# Patient Record
Sex: Female | Born: 1970
Health system: Southern US, Community
[De-identification: ages and names within clinical notes are randomized; demographics above are authoritative.]

## PROBLEM LIST (undated history)

## (undated) DIAGNOSIS — F319 Bipolar disorder, unspecified: Secondary | ICD-10-CM

## (undated) DIAGNOSIS — M797 Fibromyalgia: Secondary | ICD-10-CM

## (undated) DIAGNOSIS — M1990 Unspecified osteoarthritis, unspecified site: Secondary | ICD-10-CM

## (undated) DIAGNOSIS — F4310 Post-traumatic stress disorder, unspecified: Secondary | ICD-10-CM

## (undated) DIAGNOSIS — E119 Type 2 diabetes mellitus without complications: Secondary | ICD-10-CM

## (undated) DIAGNOSIS — M549 Dorsalgia, unspecified: Secondary | ICD-10-CM

## (undated) HISTORY — PX: COMPARTMENT SYNDROME MEASUREMENT: CATH118296

---

## 1998-02-18 ENCOUNTER — Emergency Department (HOSPITAL_COMMUNITY): Admission: EM | Admit: 1998-02-18 | Discharge: 1998-02-18 | Payer: Self-pay | Admitting: Emergency Medicine

## 1998-10-11 ENCOUNTER — Encounter: Payer: Self-pay | Admitting: Emergency Medicine

## 1998-10-11 ENCOUNTER — Emergency Department (HOSPITAL_COMMUNITY): Admission: EM | Admit: 1998-10-11 | Discharge: 1998-10-11 | Payer: Self-pay | Admitting: Emergency Medicine

## 1999-03-23 ENCOUNTER — Encounter: Payer: Self-pay | Admitting: Emergency Medicine

## 1999-03-23 ENCOUNTER — Emergency Department (HOSPITAL_COMMUNITY): Admission: EM | Admit: 1999-03-23 | Discharge: 1999-03-23 | Payer: Self-pay | Admitting: Emergency Medicine

## 1999-09-06 ENCOUNTER — Emergency Department (HOSPITAL_COMMUNITY): Admission: EM | Admit: 1999-09-06 | Discharge: 1999-09-06 | Payer: Self-pay

## 1999-12-15 ENCOUNTER — Emergency Department (HOSPITAL_COMMUNITY): Admission: EM | Admit: 1999-12-15 | Discharge: 1999-12-15 | Payer: Self-pay

## 2000-06-06 ENCOUNTER — Encounter: Payer: Self-pay | Admitting: Internal Medicine

## 2000-06-06 ENCOUNTER — Emergency Department (HOSPITAL_COMMUNITY): Admission: EM | Admit: 2000-06-06 | Discharge: 2000-06-06 | Payer: Self-pay | Admitting: Internal Medicine

## 2000-12-31 ENCOUNTER — Encounter: Payer: Self-pay | Admitting: Emergency Medicine

## 2000-12-31 ENCOUNTER — Emergency Department (HOSPITAL_COMMUNITY): Admission: EM | Admit: 2000-12-31 | Discharge: 2000-12-31 | Payer: Self-pay | Admitting: Emergency Medicine

## 2003-01-07 ENCOUNTER — Ambulatory Visit (HOSPITAL_COMMUNITY): Admission: RE | Admit: 2003-01-07 | Discharge: 2003-01-07 | Payer: Self-pay | Admitting: Family Medicine

## 2003-01-07 ENCOUNTER — Encounter: Payer: Self-pay | Admitting: Family Medicine

## 2003-03-09 ENCOUNTER — Encounter: Admission: RE | Admit: 2003-03-09 | Discharge: 2003-03-09 | Payer: Self-pay | Admitting: Family Medicine

## 2003-03-09 ENCOUNTER — Encounter: Payer: Self-pay | Admitting: Radiology

## 2003-03-09 ENCOUNTER — Encounter: Payer: Self-pay | Admitting: Family Medicine

## 2003-03-28 ENCOUNTER — Encounter: Payer: Self-pay | Admitting: Family Medicine

## 2003-03-28 ENCOUNTER — Encounter: Admission: RE | Admit: 2003-03-28 | Discharge: 2003-03-28 | Payer: Self-pay | Admitting: Family Medicine

## 2003-04-11 ENCOUNTER — Encounter: Payer: Self-pay | Admitting: Family Medicine

## 2003-04-11 ENCOUNTER — Encounter: Admission: RE | Admit: 2003-04-11 | Discharge: 2003-04-11 | Payer: Self-pay | Admitting: Family Medicine

## 2003-04-17 ENCOUNTER — Emergency Department (HOSPITAL_COMMUNITY): Admission: EM | Admit: 2003-04-17 | Discharge: 2003-04-17 | Payer: Self-pay | Admitting: Emergency Medicine

## 2003-04-17 ENCOUNTER — Encounter: Payer: Self-pay | Admitting: Emergency Medicine

## 2003-06-04 ENCOUNTER — Emergency Department (HOSPITAL_COMMUNITY): Admission: EM | Admit: 2003-06-04 | Discharge: 2003-06-04 | Payer: Self-pay | Admitting: Emergency Medicine

## 2003-06-22 ENCOUNTER — Emergency Department (HOSPITAL_COMMUNITY): Admission: EM | Admit: 2003-06-22 | Discharge: 2003-06-22 | Payer: Self-pay | Admitting: *Deleted

## 2003-07-07 ENCOUNTER — Emergency Department (HOSPITAL_COMMUNITY): Admission: EM | Admit: 2003-07-07 | Discharge: 2003-07-08 | Payer: Self-pay | Admitting: Emergency Medicine

## 2003-12-20 ENCOUNTER — Emergency Department (HOSPITAL_COMMUNITY): Admission: EM | Admit: 2003-12-20 | Discharge: 2003-12-20 | Payer: Self-pay | Admitting: Emergency Medicine

## 2004-01-16 ENCOUNTER — Emergency Department (HOSPITAL_COMMUNITY): Admission: EM | Admit: 2004-01-16 | Discharge: 2004-01-16 | Payer: Self-pay | Admitting: Emergency Medicine

## 2004-02-17 ENCOUNTER — Emergency Department (HOSPITAL_COMMUNITY): Admission: EM | Admit: 2004-02-17 | Discharge: 2004-02-17 | Payer: Self-pay | Admitting: Emergency Medicine

## 2004-04-14 ENCOUNTER — Emergency Department (HOSPITAL_COMMUNITY): Admission: EM | Admit: 2004-04-14 | Discharge: 2004-04-14 | Payer: Self-pay | Admitting: Emergency Medicine

## 2004-05-21 ENCOUNTER — Emergency Department (HOSPITAL_COMMUNITY): Admission: EM | Admit: 2004-05-21 | Discharge: 2004-05-21 | Payer: Self-pay | Admitting: Emergency Medicine

## 2004-05-28 ENCOUNTER — Emergency Department (HOSPITAL_COMMUNITY): Admission: EM | Admit: 2004-05-28 | Discharge: 2004-05-28 | Payer: Self-pay | Admitting: Emergency Medicine

## 2004-05-29 ENCOUNTER — Emergency Department (HOSPITAL_COMMUNITY): Admission: EM | Admit: 2004-05-29 | Discharge: 2004-05-29 | Payer: Self-pay | Admitting: Emergency Medicine

## 2004-07-05 ENCOUNTER — Emergency Department (HOSPITAL_COMMUNITY): Admission: EM | Admit: 2004-07-05 | Discharge: 2004-07-05 | Payer: Self-pay | Admitting: Emergency Medicine

## 2004-09-26 ENCOUNTER — Emergency Department (HOSPITAL_COMMUNITY): Admission: EM | Admit: 2004-09-26 | Discharge: 2004-09-26 | Payer: Self-pay | Admitting: *Deleted

## 2015-01-30 ENCOUNTER — Emergency Department (HOSPITAL_COMMUNITY): Payer: Medicaid Other

## 2015-01-30 ENCOUNTER — Encounter (HOSPITAL_COMMUNITY): Payer: Self-pay | Admitting: Emergency Medicine

## 2015-01-30 ENCOUNTER — Emergency Department (HOSPITAL_COMMUNITY)
Admission: EM | Admit: 2015-01-30 | Discharge: 2015-01-30 | Disposition: A | Discharge status: Home / Self Care | Payer: Medicaid Other | Attending: Physician Assistant | Admitting: Physician Assistant

## 2015-01-30 DIAGNOSIS — Y939 Activity, unspecified: Secondary | ICD-10-CM | POA: Insufficient documentation

## 2015-01-30 DIAGNOSIS — W228XXA Striking against or struck by other objects, initial encounter: Secondary | ICD-10-CM | POA: Insufficient documentation

## 2015-01-30 DIAGNOSIS — M797 Fibromyalgia: Secondary | ICD-10-CM | POA: Diagnosis not present

## 2015-01-30 DIAGNOSIS — S6992XA Unspecified injury of left wrist, hand and finger(s), initial encounter: Secondary | ICD-10-CM | POA: Diagnosis present

## 2015-01-30 DIAGNOSIS — Y999 Unspecified external cause status: Secondary | ICD-10-CM | POA: Insufficient documentation

## 2015-01-30 DIAGNOSIS — E119 Type 2 diabetes mellitus without complications: Secondary | ICD-10-CM | POA: Insufficient documentation

## 2015-01-30 DIAGNOSIS — Z79899 Other long term (current) drug therapy: Secondary | ICD-10-CM | POA: Diagnosis not present

## 2015-01-30 DIAGNOSIS — M1990 Unspecified osteoarthritis, unspecified site: Secondary | ICD-10-CM | POA: Diagnosis not present

## 2015-01-30 DIAGNOSIS — Y929 Unspecified place or not applicable: Secondary | ICD-10-CM | POA: Diagnosis not present

## 2015-01-30 DIAGNOSIS — S63617A Unspecified sprain of left little finger, initial encounter: Secondary | ICD-10-CM | POA: Insufficient documentation

## 2015-01-30 DIAGNOSIS — F319 Bipolar disorder, unspecified: Secondary | ICD-10-CM | POA: Diagnosis not present

## 2015-01-30 DIAGNOSIS — S63619A Unspecified sprain of unspecified finger, initial encounter: Secondary | ICD-10-CM

## 2015-01-30 HISTORY — DX: Post-traumatic stress disorder, unspecified: F43.10

## 2015-01-30 HISTORY — DX: Bipolar disorder, unspecified: F31.9

## 2015-01-30 HISTORY — DX: Type 2 diabetes mellitus without complications: E11.9

## 2015-01-30 HISTORY — DX: Unspecified osteoarthritis, unspecified site: M19.90

## 2015-01-30 HISTORY — DX: Fibromyalgia: M79.7

## 2015-01-30 HISTORY — DX: Dorsalgia, unspecified: M54.9

## 2015-01-30 MED ORDER — HYDROCODONE-ACETAMINOPHEN 5-325 MG PO TABS: 1.0000 | ORAL_TABLET | Freq: Four times a day (QID) | ORAL | Status: DC | PRN

## 2015-01-30 NOTE — Discharge Instructions (Signed)

## 2015-01-30 NOTE — ED Notes (Signed)
Pt stated that she feels like her finger s dislocated. Pt fell forward last night, and grabbed railing with l/hand, 5th finger caught the top of the rail.

## 2015-01-30 NOTE — ED Provider Notes (Signed)
CSN: [id number]     Arrival date & time 01/30/15  1031 History   First MD Initiated Contact with Patient 01/30/15 1101     Chief Complaint  Patient presents with  . Hand Pain    pt hit railing with l./hand     (Consider location/radiation/quality/duration/timing/severity/associated sxs/prior Treatment) HPI Comments: Patient presents to the emergency department with chief complaint of left small finger pain. She states that she stumbled last night and caught herself on her deck railing with her small finger. She complains of persistent pain. She has not tried taking anything to alleviate the symptoms. It is aggravated with movement and palpation. Pain radiates into her hand. There are no other associated symptoms.  The history is provided by the patient. No language interpreter was used.    Past Medical History  Diagnosis Date  . Diabetes mellitus without complication   . Back pain   . Arthritis   . Fibromyalgia   . Bipolar 1 disorder   . PTSD (post-traumatic stress disorder)    Past Surgical History  Procedure Laterality Date  . Cesarean section     Family History  Problem Relation Age of Onset  . Diabetes Mother   . Cancer Mother   . Hypertension Mother   . Diabetes Father   . Cancer Father   . Hypertension Father    History  Substance Use Topics  . Smoking status: Never Smoker   . Smokeless tobacco: Not on file  . Alcohol Use: No   OB History    No data available     Review of Systems  Constitutional: Negative for fever and chills.  Respiratory: Negative for shortness of breath.   Cardiovascular: Negative for chest pain.  Gastrointestinal: Negative for nausea, vomiting, diarrhea and constipation.  Genitourinary: Negative for dysuria.  Musculoskeletal: Positive for arthralgias.      Allergies  Ibuprofen  Home Medications   Prior to Admission medications   Medication Sig Start Date End Date Taking? Authorizing Provider  Aspirin-Acetaminophen-Caffeine  (GOODY HEADACHE PO) Take 1 each by mouth 3 (three) times daily as needed (headaches).   Yes Historical Provider, MD  metFORMIN (GLUCOPHAGE) 500 MG tablet Take 500 mg by mouth 2 (two) times daily with a meal.   Yes Historical Provider, MD  PARoxetine (PAXIL) 10 MG tablet Take 10 mg by mouth daily.   Yes Historical Provider, MD  traMADol (ULTRAM) 50 MG tablet Take 50 mg by mouth every 6 (six) hours as needed for moderate pain.   Yes Historical Provider, MD   BP 109/55 mmHg  Pulse 84  SpO2 99%  LMP 01/23/2015 (Exact Date) Physical Exam  Constitutional: She is oriented to person, place, and time. She appears well-developed and well-nourished.  HENT:  Head: Normocephalic and atraumatic.  Eyes: Conjunctivae and EOM are normal.  Neck: Normal range of motion.  Cardiovascular: Normal rate and intact distal pulses.   Intact distal pulses with brisk cap refill  Pulmonary/Chest: Effort normal.  Abdominal: She exhibits no distension.  Musculoskeletal: Normal range of motion.  Left small finger tender to palpation at the proximal phalanx and MCP, there is moderate associated metacarpal tenderness, range of motion and strength is limited secondary to pain  Neurological: She is alert and oriented to person, place, and time.  Sensation intact  Skin: Skin is dry.  Psychiatric: She has a normal mood and affect. Her behavior is normal. Judgment and thought content normal.  Nursing note and vitals reviewed.   ED Course  Procedures (including  critical care time) Labs Review Labs Reviewed - No data to display  Imaging Review No results found.   EKG Interpretation None     SPLINT APPLICATION Date/Time: 1:20 PM Authorized by: Roxy Horseman Consent: Verbal consent obtained. Risks and benefits: risks, benefits and alternatives were discussed Consent given by: patient Splint applied by: RN Location details: left 5th finger Splint type: static Supplies used: coband and straight finger  splint Post-procedure: The splinted body part was neurovascularly unchanged following the procedure. Patient tolerance: Patient tolerated the procedure well with no immediate complications.    MDM   Final diagnoses:  Finger sprain, initial encounter    Patient with left small finger injury.  Will check plain films.  Will reassess.  Plain films are negative.  Will treat for finger sprain.    Roxy Horseman, PA-C 01/30/15 1321  Courteney Lyn Corlis Leak, MD 01/30/15 1459

## 2015-01-31 NOTE — Progress Notes (Signed)
Orthopedic Tech Progress Note Patient Details:  Jenny Combs 1970-12-11 [id number]  Ortho Devices Type of Ortho Device: Finger splint   [name] 01/31/2015, 5:48 PM

## 2016-09-23 ENCOUNTER — Encounter: Payer: Self-pay | Admitting: Emergency Medicine

## 2016-09-23 ENCOUNTER — Emergency Department: Payer: Medicaid Other

## 2016-09-23 ENCOUNTER — Emergency Department
Admission: EM | Admit: 2016-09-23 | Discharge: 2016-09-23 | Disposition: A | Discharge status: Home / Self Care | Payer: Medicaid Other | Attending: Emergency Medicine | Admitting: Emergency Medicine

## 2016-09-23 DIAGNOSIS — Z7982 Long term (current) use of aspirin: Secondary | ICD-10-CM | POA: Insufficient documentation

## 2016-09-23 DIAGNOSIS — Z5321 Procedure and treatment not carried out due to patient leaving prior to being seen by health care provider: Secondary | ICD-10-CM | POA: Insufficient documentation

## 2016-09-23 DIAGNOSIS — R0602 Shortness of breath: Secondary | ICD-10-CM | POA: Insufficient documentation

## 2016-09-23 DIAGNOSIS — E119 Type 2 diabetes mellitus without complications: Secondary | ICD-10-CM | POA: Insufficient documentation

## 2016-09-23 LAB — BASIC METABOLIC PANEL
Anion gap: 6 (ref 5–15)
BUN: 10 mg/dL (ref 6–20)
CO2: 27 mmol/L (ref 22–32)
Calcium: 9.3 mg/dL (ref 8.9–10.3)
Chloride: 106 mmol/L (ref 101–111)
Creatinine, Ser: 0.74 mg/dL (ref 0.44–1.00)
GFR calc Af Amer: >60 mL/min (ref >60)
GFR calc non Af Amer: >60 mL/min (ref >60)
Glucose, Bld: 119 mg/dL — ABNORMAL HIGH (ref 65–99)
Potassium: 3.9 mmol/L (ref 3.5–5.1)
Sodium: 139 mmol/L (ref 135–145)

## 2016-09-23 LAB — CBC
HCT: 38 % (ref 35.0–47.0)
Hemoglobin: 13 g/dL (ref 12.0–16.0)
MCH: 30.8 pg (ref 26.0–34.0)
MCHC: 34.1 g/dL (ref 32.0–36.0)
MCV: 90.3 fL (ref 80.0–100.0)
Platelets: 204 10*3/uL (ref 150–440)
RBC: 4.21 MIL/uL (ref 3.80–5.20)
RDW: 13.2 % (ref 11.5–14.5)
WBC: 9.2 10*3/uL (ref 3.6–11.0)

## 2016-09-23 LAB — TROPONIN I: Troponin I: <0.03 ng/mL (ref <0.03)

## 2016-09-23 NOTE — ED Triage Notes (Signed)
Patient presents to the ED with intermittent chest pain and shortness of breath x 2 days that has been worse in the past 45 minutes.  Patient states, "it feels like someone is kicking me in the chest.  Patient denies nausea, vomiting and diaphoresis.  Patient reports having a lot of stress lately.

## 2016-09-25 ENCOUNTER — Telehealth: Payer: Self-pay | Admitting: Emergency Medicine

## 2016-09-25 NOTE — Telephone Encounter (Signed)
Called patient due to lwot to inquire about condition and follow up plans. She continues to have chest pains on and off.  I advised her to call her doctor about her symptoms and have them look at the labs/cxr we did here.  She is on her way to work now, but says she will call her doctor when she gets there.

## 2017-02-09 ENCOUNTER — Ambulatory Visit: Payer: Medicaid Other

## 2017-02-23 ENCOUNTER — Ambulatory Visit (INDEPENDENT_AMBULATORY_CARE_PROVIDER_SITE_OTHER): Payer: Medicare Other | Admitting: Pharmacist

## 2017-02-23 ENCOUNTER — Other Ambulatory Visit (HOSPITAL_COMMUNITY)
Admission: RE | Admit: 2017-02-23 | Discharge: 2017-02-23 | Disposition: A | Discharge status: Home / Self Care | Payer: Medicare Other | Source: Ambulatory Visit | Attending: Infectious Disease | Admitting: Infectious Disease

## 2017-02-23 ENCOUNTER — Other Ambulatory Visit: Payer: Self-pay | Admitting: Pharmacist

## 2017-02-23 DIAGNOSIS — Z7251 High risk heterosexual behavior: Secondary | ICD-10-CM | POA: Insufficient documentation

## 2017-02-23 LAB — BASIC METABOLIC PANEL
BUN: 15 mg/dL (ref 7–25)
CO2: 26 mmol/L (ref 20–32)
Calcium: 9.5 mg/dL (ref 8.6–10.2)
Chloride: 105 mmol/L (ref 98–110)
Creat: 0.8 mg/dL (ref 0.50–1.10)
Glucose, Bld: 136 mg/dL — ABNORMAL HIGH (ref 65–99)
Potassium: 5.3 mmol/L (ref 3.5–5.3)
Sodium: 139 mmol/L (ref 135–146)

## 2017-02-23 LAB — POCT URINE PREGNANCY: Preg Test, Ur: NEGATIVE
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
## 2017-02-24 ENCOUNTER — Other Ambulatory Visit: Payer: Self-pay | Admitting: Pharmacist

## 2017-02-24 ENCOUNTER — Telehealth: Payer: Self-pay | Admitting: Pharmacist

## 2017-02-24 DIAGNOSIS — Z7251 High risk heterosexual behavior: Secondary | ICD-10-CM

## 2017-02-24 LAB — URINE CYTOLOGY ANCILLARY ONLY
Chlamydia: NEGATIVE
Neisseria Gonorrhea: NEGATIVE

## 2017-02-24 LAB — HIV ANTIBODY (ROUTINE TESTING W REFLEX): HIV 1&2 Ab, 4th Generation: NONREACTIVE

## 2017-02-24 LAB — HEPATITIS B SURFACE ANTIBODY,QUALITATIVE: Hep B S Ab: NONREACTIVE

## 2017-02-24 LAB — HEPATITIS B SURFACE ANTIGEN: Hepatitis B Surface Ag: NONREACTIVE

## 2017-02-24 LAB — HEPATITIS A ANTIBODY, TOTAL: Hep A Total Ab: NONREACTIVE

## 2017-02-24 LAB — RPR

## 2017-02-24 LAB — HEPATITIS C ANTIBODY: HCV Ab: NONREACTIVE

## 2017-02-24 MED ORDER — EMTRICITABINE-TENOFOVIR DF 200-300 MG PO TABS: 1.0000 | ORAL_TABLET | Freq: Every day | ORAL | 0 refills | Status: DC

## 2017-02-24 MED FILL — TRUVADA 200-300 MG TABS: 200-300 | 30 days supply | Qty: 30 | Fill #0

## 2017-02-24 NOTE — Telephone Encounter (Signed)
Called Jenny Combs to let her know that her HIV antibody was negative. Will send in 1 month of Truvada with 0 refills to [facility]. She will have it mailed. She follows up with me again in 1 month.

## 2017-02-25 ENCOUNTER — Telehealth: Payer: Self-pay | Admitting: Pharmacist

## 2017-02-25 NOTE — Telephone Encounter (Signed)
Taron called wanting further results of her tests we performed Monday.  Told her everything was negative - Hep A, Hep B, Hep C, RPR, and gonorrhea/chlamydia testing.

## 2017-03-24 ENCOUNTER — Ambulatory Visit: Payer: Medicare Other

## 2017-03-25 ENCOUNTER — Other Ambulatory Visit: Payer: Self-pay | Admitting: Pharmacist

## 2017-03-25 DIAGNOSIS — Z7251 High risk heterosexual behavior: Secondary | ICD-10-CM

## 2017-03-26 ENCOUNTER — Other Ambulatory Visit: Payer: Self-pay | Admitting: Pharmacist

## 2017-03-31 ENCOUNTER — Ambulatory Visit (INDEPENDENT_AMBULATORY_CARE_PROVIDER_SITE_OTHER): Payer: Medicare Other | Admitting: Pharmacist

## 2017-03-31 DIAGNOSIS — Z7251 High risk heterosexual behavior: Secondary | ICD-10-CM | POA: Diagnosis not present

## 2017-03-31 DIAGNOSIS — Z23 Encounter for immunization: Secondary | ICD-10-CM

## 2017-03-31 DIAGNOSIS — Z206 Contact with and (suspected) exposure to human immunodeficiency virus [HIV]: Secondary | ICD-10-CM

## 2017-03-31 NOTE — Progress Notes (Addendum)
HPI: Jenny Combs is a 46 y.o. female who presents to the pharmacy clinic today for a follow-up on her PrEP treatment.   Allergies: Allergies  Allergen Reactions  . Ibuprofen Nausea Only    Vitals:    Past Medical History: Past Medical History:  Diagnosis Date  . Arthritis   . Back pain   . Bipolar 1 disorder (HCC)   . Diabetes mellitus without complication (HCC)   . Fibromyalgia   . PTSD (post-traumatic stress disorder)     Social History: Social History   Social History  . Marital status: Single    Spouse name: N/A  . Number of children: N/A  . Years of education: N/A   Social History Main Topics  . Smoking status: Never Smoker  . Smokeless tobacco: Never Used  . Alcohol use No  . Drug use: Unknown  . Sexual activity: No   Other Topics Concern  . Not on file   Social History Narrative  . No narrative on file    Current Regimen: Truvada   Labs: Hep B S Ab (no units)  Date Value  02/23/2017 NON-REACTIVE   Hepatitis B Surface Ag (no units)  Date Value  02/23/2017 NON-REACTIVE   HCV Ab (no units)  Date Value  02/23/2017 NON-REACTIVE   Assessment: Jenny Combs presents to the clinic today to follow-up on her PrEP treatment. She started treatment about 1 month ago and ran out of her medication exactly 4 days ago. She reports no missed doses aside from the past 4 days due to running out of medication. She did have some significant GI upset for her first week of treatment with Truvada which has since improved.   Jenny Combs is still currently living with her HIV- infected partner but they have not been sleeping together due to her frustration over him concealing his HIV-infected status. They remain in a monogamous relationship despite these frustrations. Her partner does have an undetectable viral load. Jenny Combs does have some misconceptions about HIV and expressed concern over her partner dying. We explained that he should be able to live a long and normal life as long as he  takes his medications.   We will check her HIV antibody today and send in a refill of herTruvada if it remains negative. She is also not immune to hepatitis A or hepatitis B and wants to start the vaccine series today. She will also receive her flu shot.   She was also provided with condoms.   Recommendations: - Continue taking Truvada every day - Start Hepatitis A and Hepatitis B vaccine series today  - Flu shot today - F/U with pharmacy clinic on 11/06 for Hep B vaccine # 2 - F/U with pharmacy clinic on 1/03 for PrEP f/u  Della Goo, PharmD PGY2 Infectious Diseases Pharmacy Resident [phone] 03/31/2017, 9:43 AM

## 2017-04-01 LAB — HIV ANTIBODY (ROUTINE TESTING W REFLEX): HIV 1&2 Ab, 4th Generation: NONREACTIVE

## 2017-04-02 ENCOUNTER — Telehealth: Payer: Self-pay | Admitting: Pharmacist

## 2017-04-02 ENCOUNTER — Other Ambulatory Visit: Payer: Self-pay | Admitting: Pharmacist

## 2017-04-02 DIAGNOSIS — Z7251 High risk heterosexual behavior: Secondary | ICD-10-CM

## 2017-04-02 MED ORDER — EMTRICITABINE-TENOFOVIR DF 200-300 MG PO TABS: 1.0000 | ORAL_TABLET | Freq: Every day | ORAL | 2 refills | Status: DC

## 2017-04-02 MED FILL — TRUVADA 200-300 MG TABS: 200-300 | 30 days supply | Qty: 30 | Fill #0

## 2017-04-02 NOTE — Telephone Encounter (Signed)
Called Jenny Combs to tell her that her HIV antibody was negative.  Sent in Truvada x 3 months to [facility].

## 2017-04-27 MED FILL — TRUVADA 200-300 MG TABS: 200-300 | 30 days supply | Qty: 30 | Fill #1

## 2017-05-05 ENCOUNTER — Ambulatory Visit: Payer: Medicare Other

## 2017-05-20 MED FILL — TRUVADA 200-300 MG TABS: 200-300 | 30 days supply | Qty: 30 | Fill #2

## 2017-06-03 ENCOUNTER — Telehealth: Payer: Self-pay | Admitting: *Deleted

## 2017-06-03 NOTE — Telephone Encounter (Signed)
[facility] [place] [facility] asking for last office note and prep lab results. Notes faxed to [phone] and confirmation received. Wendall MolaJacqueline Cockerham

## 2017-07-02 ENCOUNTER — Ambulatory Visit: Payer: Medicare Other

## 2017-07-06 ENCOUNTER — Ambulatory Visit: Payer: Medicare Other

## 2018-09-19 ENCOUNTER — Emergency Department (HOSPITAL_COMMUNITY)
Admission: EM | Admit: 2018-09-19 | Discharge: 2018-09-19 | Disposition: A | Discharge status: Home / Self Care | Payer: Medicare Other | Attending: Emergency Medicine | Admitting: Emergency Medicine

## 2018-09-19 ENCOUNTER — Encounter (HOSPITAL_COMMUNITY): Payer: Self-pay | Admitting: Emergency Medicine

## 2018-09-19 ENCOUNTER — Other Ambulatory Visit: Payer: Self-pay

## 2018-09-19 ENCOUNTER — Emergency Department (HOSPITAL_COMMUNITY): Payer: Medicare Other

## 2018-09-19 DIAGNOSIS — Z7984 Long term (current) use of oral hypoglycemic drugs: Secondary | ICD-10-CM | POA: Insufficient documentation

## 2018-09-19 DIAGNOSIS — J029 Acute pharyngitis, unspecified: Secondary | ICD-10-CM | POA: Diagnosis not present

## 2018-09-19 DIAGNOSIS — E119 Type 2 diabetes mellitus without complications: Secondary | ICD-10-CM | POA: Insufficient documentation

## 2018-09-19 DIAGNOSIS — Z79899 Other long term (current) drug therapy: Secondary | ICD-10-CM | POA: Diagnosis not present

## 2018-09-19 DIAGNOSIS — J020 Streptococcal pharyngitis: Secondary | ICD-10-CM

## 2018-09-19 LAB — CBC WITH DIFFERENTIAL/PLATELET
Abs Immature Granulocytes: 0.12 10*3/uL — ABNORMAL HIGH (ref 0.00–0.07)
Basophils Absolute: 0.1 10*3/uL (ref 0.0–0.1)
Basophils Relative: 0 %
Eosinophils Absolute: 0.3 10*3/uL (ref 0.0–0.5)
Eosinophils Relative: 1 %
HCT: 42.1 % (ref 36.0–46.0)
Hemoglobin: 13.3 g/dL (ref 12.0–15.0)
Immature Granulocytes: 1 %
Lymphocytes Relative: 11 %
Lymphs Abs: 2.1 10*3/uL (ref 0.7–4.0)
MCH: 29.2 pg (ref 26.0–34.0)
MCHC: 31.6 g/dL (ref 30.0–36.0)
MCV: 92.5 fL (ref 80.0–100.0)
Monocytes Absolute: 1.1 10*3/uL — ABNORMAL HIGH (ref 0.1–1.0)
Monocytes Relative: 5 %
Neutro Abs: 16.1 10*3/uL — ABNORMAL HIGH (ref 1.7–7.7)
Neutrophils Relative %: 82 %
Platelets: 216 10*3/uL (ref 150–400)
RBC: 4.55 MIL/uL (ref 3.87–5.11)
RDW: 12.2 % (ref 11.5–15.5)
WBC: 19.7 10*3/uL — ABNORMAL HIGH (ref 4.0–10.5)
nRBC: 0 % (ref 0.0–0.2)

## 2018-09-19 LAB — COMPREHENSIVE METABOLIC PANEL
ALT: 17 U/L (ref 0–44)
AST: 16 U/L (ref 15–41)
Albumin: 4 g/dL (ref 3.5–5.0)
Alkaline Phosphatase: 47 U/L (ref 38–126)
Anion gap: 8 (ref 5–15)
BUN: 10 mg/dL (ref 6–20)
CO2: 25 mmol/L (ref 22–32)
Calcium: 9.3 mg/dL (ref 8.9–10.3)
Chloride: 106 mmol/L (ref 98–111)
Creatinine, Ser: 0.79 mg/dL (ref 0.44–1.00)
GFR calc Af Amer: >60 mL/min (ref >60)
GFR calc non Af Amer: >60 mL/min (ref >60)
Glucose, Bld: 167 mg/dL — ABNORMAL HIGH (ref 70–99)
Potassium: 3.5 mmol/L (ref 3.5–5.1)
Sodium: 139 mmol/L (ref 135–145)
Total Bilirubin: 0.8 mg/dL (ref 0.3–1.2)
Total Protein: 7.5 g/dL (ref 6.5–8.1)

## 2018-09-19 LAB — GROUP A STREP BY PCR: Group A Strep by PCR: DETECTED — AB

## 2018-09-19 LAB — INFLUENZA PANEL BY PCR (TYPE A & B)
Influenza A By PCR: NEGATIVE
Influenza B By PCR: NEGATIVE

## 2018-09-19 MED ORDER — ONDANSETRON HCL 4 MG/2ML IJ SOLN
INTRAMUSCULAR | Status: AC
  Administered 2018-09-19: 4 mg
  Filled 2018-09-19: qty 4

## 2018-09-19 MED ORDER — PENICILLIN G BENZATHINE 1200000 UNIT/2ML IM SUSP
1.2000 10*6.[IU] | Freq: Once | INTRAMUSCULAR | Status: AC
  Administered 2018-09-19: 1.2 10*6.[IU] via INTRAMUSCULAR
  Filled 2018-09-19: qty 2

## 2018-09-19 MED ORDER — ONDANSETRON HCL 4 MG PO TABS: 4.0000 mg | ORAL_TABLET | Freq: Four times a day (QID) | ORAL | 0 refills | Status: DC | PRN

## 2018-09-19 NOTE — Discharge Instructions (Addendum)
You may take Tylenol and ibuprofen as needed for fever and muscle aches.

## 2018-09-19 NOTE — ED Triage Notes (Signed)
No flu shot this year   Grandson with Corona Virus testing this week after flu was negative- no strep test  Pt here with complaint of sore throat, fever, mailse, body aches

## 2018-09-19 NOTE — ED Provider Notes (Signed)
[facility] EMERGENCY DEPARTMENT Provider Note   CSN: [id number] Arrival date & time: 09/19/18  2131    History   Chief Complaint Chief Complaint  Patient presents with  . Influenza    HPI Jenny Combs is a 48 y.o. female.     HPI Patient presents with 1 day of subjective fevers and chills, sore throat and diffuse body aches.  No cough.  No recent travel or known Covid exposures.  Patient's grandson was tested for Covid but results of test are unknown.  Patient with no difficulty breathing.  No voice changes. Past Medical History:  Diagnosis Date  . Arthritis   . Back pain   . Bipolar 1 disorder (HCC)   . Diabetes mellitus without complication (HCC)   . Fibromyalgia   . PTSD (post-traumatic stress disorder)     There are no active problems to display for this patient.   Past Surgical History:  Procedure Laterality Date  . CESAREAN SECTION    . COMPARTMENT SYNDROME MEASUREMENT       OB History   No obstetric history on file.      Home Medications    Prior to Admission medications   Medication Sig Start Date End Date Taking? Authorizing Provider  oxyCODONE-acetaminophen (PERCOCET) 10-325 MG tablet Take 1 tablet by mouth every 4 (four) hours as needed for pain.   Yes [provider]  Aspirin-Acetaminophen-Caffeine (GOODY HEADACHE PO) Take 1 each by mouth 3 (three) times daily as needed (headaches).    [provider]  emtricitabine-tenofovir (TRUVADA) 200-300 MG tablet Take 1 tablet by mouth daily. 04/02/17   Kuppelweiser, Cassie L, RPH-CPP  HYDROcodone-acetaminophen (NORCO/VICODIN) 5-325 MG per tablet Take 1 tablet by mouth every 6 (six) hours as needed. 01/30/15   Roxy Horseman, PA-C  metFORMIN (GLUCOPHAGE) 500 MG tablet Take 500 mg by mouth 2 (two) times daily with a meal.    [provider]  ondansetron (ZOFRAN) 4 MG tablet Take 1 tablet (4 mg total) by mouth every 6 (six) hours as needed for nausea. 09/19/18   Loren Racer, MD   PARoxetine (PAXIL) 10 MG tablet Take 10 mg by mouth daily.    [provider]  traMADol (ULTRAM) 50 MG tablet Take 50 mg by mouth every 6 (six) hours as needed for moderate pain.    [provider]    Family History Family History  Problem Relation Age of Onset  . Diabetes Mother   . Cancer Mother   . Hypertension Mother   . Diabetes Father   . Cancer Father   . Hypertension Father     Social History Social History   Tobacco Use  . Smoking status: Never Smoker  . Smokeless tobacco: Never Used  Substance Use Topics  . Alcohol use: No  . Drug use: Never     Allergies   Ibuprofen   Review of Systems Review of Systems  Constitutional: Positive for chills and fever.  HENT: Positive for sore throat. Negative for congestion, rhinorrhea and trouble swallowing.   Eyes: Negative for visual disturbance.  Respiratory: Negative for cough and shortness of breath.   Cardiovascular: Negative for chest pain.  Gastrointestinal: Positive for nausea. Negative for abdominal pain, constipation, diarrhea and vomiting.  Genitourinary: Negative for dysuria, flank pain and frequency.  Musculoskeletal: Positive for myalgias. Negative for neck pain and neck stiffness.  Skin: Negative for rash and wound.  Neurological: Negative for dizziness, weakness, light-headedness, numbness and headaches.  All other systems reviewed and are  negative.    Physical Exam Updated Vital Signs BP 131/66 (BP Location: Right Arm)   Pulse 99   Temp 100 F (37.8 C) (Oral)   Resp 17   Ht 5\' 11"  (1.803 m)   Wt 89.8 kg   LMP 09/05/2018 (Approximate)   SpO2 97%   BMI 27.62 kg/m   Physical Exam Vitals signs and nursing note reviewed.  Constitutional:      Appearance: Normal appearance. She is well-developed.  HENT:     Head: Normocephalic and atraumatic.     Nose: Nose normal.     Comments: Posterior oropharyngeal erythema.  No exudates.  Uvula is midline.    Mouth/Throat:     Mouth:  Mucous membranes are moist.     Pharynx: Posterior oropharyngeal erythema present.  Eyes:     Extraocular Movements: Extraocular movements intact.     Pupils: Pupils are equal, round, and reactive to light.  Neck:     Musculoskeletal: Normal range of motion and neck supple. No neck rigidity or muscular tenderness.     Comments: No meningismus Cardiovascular:     Rate and Rhythm: Normal rate and regular rhythm.  Pulmonary:     Effort: Pulmonary effort is normal. No respiratory distress.     Breath sounds: Normal breath sounds. No stridor. No wheezing, rhonchi or rales.  Chest:     Chest wall: No tenderness.  Abdominal:     General: Bowel sounds are normal.     Palpations: Abdomen is soft.     Tenderness: There is no abdominal tenderness. There is no guarding or rebound.  Musculoskeletal: Normal range of motion.        General: No swelling, tenderness, deformity or signs of injury.     Right lower leg: No edema.     Left lower leg: No edema.  Lymphadenopathy:     Cervical: Cervical adenopathy present.  Skin:    General: Skin is warm and dry.     Capillary Refill: Capillary refill takes less than 2 seconds.     Findings: No erythema or rash.  Neurological:     General: No focal deficit present.     Mental Status: She is alert and oriented to person, place, and time.  Psychiatric:        Behavior: Behavior normal.      ED Treatments / Results  Labs (all labs ordered are listed, but only abnormal results are displayed) Labs Reviewed  GROUP A STREP BY PCR - Abnormal; Notable for the following components:      Result Value   Group A Strep by PCR DETECTED (*)    All other components within normal limits  CBC WITH DIFFERENTIAL/PLATELET - Abnormal; Notable for the following components:   WBC 19.7 (*)    Neutro Abs 16.1 (*)    Monocytes Absolute 1.1 (*)    Abs Immature Granulocytes 0.12 (*)    All other components within normal limits  COMPREHENSIVE METABOLIC PANEL -  Abnormal; Notable for the following components:   Glucose, Bld 167 (*)    All other components within normal limits  INFLUENZA PANEL BY PCR (TYPE A & B)    EKG None  Radiology Dg Chest 1 View  Result Date: 09/19/2018 CLINICAL DATA:  Corona exposure, flu like symptoms. Grandson corona test pending. Patient reports sore throat, fever, body aches. EXAM: CHEST  1 VIEW COMPARISON:  09/23/2016 FINDINGS: Bronchial thickening.The cardiomediastinal contours are normal. Pulmonary vasculature is normal. No consolidation, pleural effusion, or pneumothorax.  No acute osseous abnormalities are seen. IMPRESSION: Bronchial thickening suggesting bronchitis or reactive airways disease. No focal airspace disease. Electronically Signed   By: Narda Rutherford M.D.   On: 09/19/2018 22:45    Procedures Procedures (including critical care time)  Medications Ordered in ED Medications  penicillin g benzathine (BICILLIN LA) 1200000 UNIT/2ML injection 1.2 Million Units (has no administration in time range)  ondansetron (ZOFRAN) 4 MG/2ML injection (4 mg  Given 09/19/18 2150)     Initial Impression / Assessment and Plan / ED Course  I have reviewed the triage vital signs and the nursing notes.  Pertinent labs & imaging results that were available during my care of the patient were reviewed by me and considered in my medical decision making (see chart for details).        Strep positive consistent with her exam.  Given IM penicillin.  Will treat symptomatically.  Final Clinical Impressions(s) / ED Diagnoses   Final diagnoses:  Strep pharyngitis    ED Discharge Orders         Ordered    ondansetron (ZOFRAN) 4 MG tablet  Every 6 hours PRN     09/19/18 2254           Loren Racer, MD 09/19/18 2254

## 2018-10-28 ENCOUNTER — Encounter (HOSPITAL_COMMUNITY): Payer: Self-pay | Admitting: Emergency Medicine

## 2018-10-28 ENCOUNTER — Other Ambulatory Visit: Payer: Self-pay

## 2018-10-28 ENCOUNTER — Emergency Department (HOSPITAL_COMMUNITY)
Admission: EM | Admit: 2018-10-28 | Discharge: 2018-10-28 | Disposition: A | Discharge status: Home / Self Care | Payer: Medicare Other | Attending: Emergency Medicine | Admitting: Emergency Medicine

## 2018-10-28 DIAGNOSIS — Z7984 Long term (current) use of oral hypoglycemic drugs: Secondary | ICD-10-CM | POA: Insufficient documentation

## 2018-10-28 DIAGNOSIS — M79661 Pain in right lower leg: Secondary | ICD-10-CM | POA: Diagnosis not present

## 2018-10-28 DIAGNOSIS — M7989 Other specified soft tissue disorders: Secondary | ICD-10-CM | POA: Diagnosis not present

## 2018-10-28 DIAGNOSIS — Z79899 Other long term (current) drug therapy: Secondary | ICD-10-CM | POA: Insufficient documentation

## 2018-10-28 DIAGNOSIS — E119 Type 2 diabetes mellitus without complications: Secondary | ICD-10-CM | POA: Diagnosis not present

## 2018-10-28 MED ORDER — RIVAROXABAN 15 MG PO TABS
15.0000 mg | ORAL_TABLET | Freq: Once | ORAL | Status: AC
  Administered 2018-10-28: 15 mg via ORAL
  Filled 2018-10-28 (×2): qty 1

## 2018-10-28 NOTE — Discharge Instructions (Addendum)
Return tomorrow for ultrasound of your leg. If the ultrasound shows you do have a blood clot, you will get a prescription for a course of anticoagulants (blood thinners). If it does not show a blood clot, then continue routine care.  Please see a podiatrist (foot doctor) about the knots on your feet.

## 2018-10-28 NOTE — ED Triage Notes (Addendum)
Patient complains of right leg pain with numbness. Patient states that the pain is worse tonight. Patient also having right leg numbness. Patient has obvious swelling to the right leg and has 2 knots that have developed on the bottom of the right foot. Patient noticed the knots today. Leg is warm to touch. Patient has a hx of compartment syndrome to the right leg.

## 2018-10-28 NOTE — ED Provider Notes (Signed)
[facility] EMERGENCY DEPARTMENT Provider Note   CSN: [id number] Arrival date & time: 10/28/18  2127    History   Chief Complaint Chief Complaint  Patient presents with  . Leg Pain    right    HPI Jenny Combs is a 48 y.o. female.   The history is provided by the patient.  Leg Pain  She has history of bipolar disorder, diabetes, fibromyalgia and comes in because of pain in her right calf and knots on her right foot.  She is status post compartment syndrome in that leg and has chronic pain and numbness related to that.  About 3 hours ago, pain got worse.  She rates pain at 7/10.  She states it feels cool to the touch and is more swollen than normal.  There is no change in chronic numbness in her leg.  She denies any trauma.  She has also noted that there are 2 kn on the plantar surface of her right foot.  She is concerned that she may have a blood clot.  She denies chest pain or dyspnea.  Past Medical History:  Diagnosis Date  . Arthritis   . Back pain   . Bipolar 1 disorder (HCC)   . Diabetes mellitus without complication (HCC)   . Fibromyalgia   . PTSD (post-traumatic stress disorder)     There are no active problems to display for this patient.   Past Surgical History:  Procedure Laterality Date  . CESAREAN SECTION    . COMPARTMENT SYNDROME MEASUREMENT       OB History   No obstetric history on file.      Home Medications    Prior to Admission medications   Medication Sig Start Date End Date Taking? Authorizing Provider  Aspirin-Acetaminophen-Caffeine (GOODY HEADACHE PO) Take 1 each by mouth 3 (three) times daily as needed (headaches).    [provider]  emtricitabine-tenofovir (TRUVADA) 200-300 MG tablet Take 1 tablet by mouth daily. 04/02/17   Kuppelweiser, Cassie L, RPH-CPP  HYDROcodone-acetaminophen (NORCO/VICODIN) 5-325 MG per tablet Take 1 tablet by mouth every 6 (six) hours as needed. 01/30/15   Roxy HorsemanBrowning, Robert, PA-C  metFORMIN (GLUCOPHAGE)  500 MG tablet Take 500 mg by mouth 2 (two) times daily with a meal.    [provider]  ondansetron (ZOFRAN) 4 MG tablet Take 1 tablet (4 mg total) by mouth every 6 (six) hours as needed for nausea. 09/19/18   Loren RacerYelverton, Tyreak Reagle, MD  oxyCODONE-acetaminophen (PERCOCET) 10-325 MG tablet Take 1 tablet by mouth every 4 (four) hours as needed for pain.    [provider]  PARoxetine (PAXIL) 10 MG tablet Take 10 mg by mouth daily.    [provider]  traMADol (ULTRAM) 50 MG tablet Take 50 mg by mouth every 6 (six) hours as needed for moderate pain.    [provider]    Family History Family History  Problem Relation Age of Onset  . Diabetes Mother   . Cancer Mother   . Hypertension Mother   . Diabetes Father   . Cancer Father   . Hypertension Father     Social History Social History   Tobacco Use  . Smoking status: Never Smoker  . Smokeless tobacco: Never Used  Substance Use Topics  . Alcohol use: No  . Drug use: Never     Allergies   Ibuprofen   Review of Systems Review of Systems  All other systems reviewed and are negative.    Physical Exam  Updated Vital Signs BP 137/62 (BP Location: Right Arm)   Pulse 89   Temp 98.2 F (36.8 C) (Oral)   Resp 18   Ht 5\' 11"  (1.803 m)   Wt 88.5 kg   LMP 10/11/2018 (Exact Date)   SpO2 100%   BMI 27.20 kg/m   Physical Exam Vitals signs and nursing note reviewed.    48 year old female, resting comfortably and in no acute distress. Vital signs are normal. Oxygen saturation is 100%, which is normal. Head is normocephalic and atraumatic. PERRLA, EOMI. Oropharynx is clear. Neck is nontender and supple without adenopathy or JVD. Back is nontender and there is no CVA tenderness. Lungs are clear without rales, wheezes, or rhonchi. Chest is nontender. Heart has regular rate and rhythm without murmur. Abdomen is soft, flat, nontender without masses or hepatosplenomegaly and peristalsis is normoactive.  Extremities: Surgical scar present medial aspect of the right lower leg.  Right calf circumference is 2 cm greater than left calf circumference.  There is no tenderness to palpation, but she has decreased sensation in that area.  There is a negative Homans sign.  2 nodules are present on the plantar aspect of the right foot each measuring about 1.5cm.  There is no erythema or warmth.  Capillary refill is prompt.  She has decreased sensation rather diffusely throughout her right lower leg and foot. Skin is warm and dry without rash. Neurologic: Mental status is normal, cranial nerves are intact, there are no motor deficits.  Sensory loss right leg as noted above, otherwise sensation is normal.  ED Treatments / Results   Procedures Procedures   Medications Ordered in ED Medications  Rivaroxaban (XARELTO) tablet 15 mg (has no administration in time range)     Initial Impression / Assessment and Plan / ED Course  I have reviewed the triage vital signs and the nursing notes.  Pain and swelling in the right lower extremity worrisome for possible DVT.  With prior surgery in that leg and compartment syndrome, she is at increased risk for DVT.  She is given a dose of rivaroxaban and will be brought back tomorrow for venous ultrasound.  I am not sure what the nodules are on her foot, advised to follow-up with a podiatrist.  Final Clinical Impressions(s) / ED Diagnoses   Final diagnoses:  Pain and swelling of right lower leg    ED Discharge Orders         Ordered    US Venous Img Lower Unilateral Right     10/28/18 2326           Dione Booze, MD 10/28/18 2333

## 2019-06-02 ENCOUNTER — Other Ambulatory Visit (HOSPITAL_COMMUNITY): Payer: Self-pay | Admitting: Physician Assistant

## 2019-06-02 DIAGNOSIS — M7989 Other specified soft tissue disorders: Secondary | ICD-10-CM

## 2019-06-02 DIAGNOSIS — M79604 Pain in right leg: Secondary | ICD-10-CM

## 2020-01-02 ENCOUNTER — Other Ambulatory Visit: Payer: Self-pay

## 2020-01-02 ENCOUNTER — Encounter: Payer: Self-pay | Admitting: Emergency Medicine

## 2020-01-02 ENCOUNTER — Ambulatory Visit
Admission: EM | Admit: 2020-01-02 | Discharge: 2020-01-02 | Disposition: A | Discharge status: Home / Self Care | Payer: Medicare HMO | Attending: Emergency Medicine | Admitting: Emergency Medicine

## 2020-01-02 DIAGNOSIS — J069 Acute upper respiratory infection, unspecified: Secondary | ICD-10-CM | POA: Diagnosis not present

## 2020-01-02 DIAGNOSIS — J040 Acute laryngitis: Secondary | ICD-10-CM

## 2020-01-02 MED ORDER — MAGIC MOUTHWASH W/LIDOCAINE: 5.0000 mL | Freq: Three times a day (TID) | ORAL | 0 refills | Status: DC | PRN

## 2020-01-02 MED ORDER — BENZONATATE 100 MG PO CAPS: 100.0000 mg | ORAL_CAPSULE | Freq: Three times a day (TID) | ORAL | 0 refills | Status: DC

## 2020-01-02 NOTE — ED Provider Notes (Signed)
[facility]   [id number] 01/02/20 Arrival Time: 1914  HG:DJME THROAT  SUBJECTIVE: History from: patient.  KELVIN BURPEE is a 49 y.o. female who presents with abrupt onset of runny nose, congestion, sore throat, hoarse voice, and dry/ productive cough x 2 days.  Admits to sick exposure to strep.  Has tried OTC medications without relief.  Symptoms are made worse with swallowing, but tolerating liquids and own secretions without difficulty.  Report previous symptoms in the past with strep.  Denies fever, chills, fatigue, SOB, wheezing, chest pain, nausea, rash, changes in bowel or bladder habits.    ROS: As per HPI.  All other pertinent ROS negative.     Past Medical History:  Diagnosis Date  . Arthritis   . Back pain   . Bipolar 1 disorder (HCC)   . Diabetes mellitus without complication (HCC)   . Fibromyalgia   . PTSD (post-traumatic stress disorder)    Past Surgical History:  Procedure Laterality Date  . CESAREAN SECTION    . COMPARTMENT SYNDROME MEASUREMENT     Allergies  Allergen Reactions  . Ibuprofen Nausea Only   No current facility-administered medications on file prior to encounter.   Current Outpatient Medications on File Prior to Encounter  Medication Sig Dispense Refill  . Aspirin-Acetaminophen-Caffeine (GOODY HEADACHE PO) Take 1 each by mouth 3 (three) times daily as needed (headaches).    Marland Kitchen emtricitabine-tenofovir (TRUVADA) 200-300 MG tablet Take 1 tablet by mouth daily. 30 tablet 2  . HYDROcodone-acetaminophen (NORCO/VICODIN) 5-325 MG per tablet Take 1 tablet by mouth every 6 (six) hours as needed. 10 tablet 0  . metFORMIN (GLUCOPHAGE) 500 MG tablet Take 500 mg by mouth 2 (two) times daily with a meal.    . ondansetron (ZOFRAN) 4 MG tablet Take 1 tablet (4 mg total) by mouth every 6 (six) hours as needed for nausea. 12 tablet 0  . oxyCODONE-acetaminophen (PERCOCET) 10-325 MG tablet Take 1 tablet by mouth every 4 (four) hours as needed for pain.    Marland Kitchen  PARoxetine (PAXIL) 10 MG tablet Take 10 mg by mouth daily.    . traMADol (ULTRAM) 50 MG tablet Take 50 mg by mouth every 6 (six) hours as needed for moderate pain.     Social History   Socioeconomic History  . Marital status: Single    Spouse name: Not on file  . Number of children: Not on file  . Years of education: Not on file  . Highest education level: Not on file  Occupational History  . Not on file  Tobacco Use  . Smoking status: Never Smoker  . Smokeless tobacco: Never Used  Vaping Use  . Vaping Use: Never used  Substance and Sexual Activity  . Alcohol use: No  . Drug use: Never  . Sexual activity: Yes    Birth control/protection: None  Other Topics Concern  . Not on file  Social History Narrative  . Not on file   Social Determinants of Health   Financial Resource Strain:   . Difficulty of Paying Living Expenses:   Food Insecurity:   . Worried About Programme researcher, broadcasting/film/video in the Last Year:   . Barista in the Last Year:   Transportation Needs:   . Freight forwarder (Medical):   Marland Kitchen Lack of Transportation (Non-Medical):   Physical Activity:   . Days of Exercise per Week:   . Minutes of Exercise per Session:   Stress:   . Feeling of Stress :  Social Connections:   . Frequency of Communication with Friends and Family:   . Frequency of Social Gatherings with Friends and Family:   . Attends Religious Services:   . Active Member of Clubs or Organizations:   . Attends Banker Meetings:   Marland Kitchen Marital Status:   Intimate Partner Violence:   . Fear of Current or Ex-Partner:   . Emotionally Abused:   Marland Kitchen Physically Abused:   . Sexually Abused:    Family History  Problem Relation Age of Onset  . Diabetes Mother   . Cancer Mother   . Hypertension Mother   . Diabetes Father   . Cancer Father   . Hypertension Father     OBJECTIVE:  Vitals:   01/02/20 1926  BP: 131/76  Pulse: 91  Resp: 17  Temp: 98.4 F (36.9 C)  TempSrc: Oral  SpO2:  95%    General appearance: alert; appears mildly fatigued, but nontoxic, speaking in full sentences and managing own secretions HEENT: NCAT; Ears: EACs clear, TMs pearly gray with visible cone of light, without erythema; Eyes: PERRL, EOMI grossly; Nose: no obvious rhinorrhea; Throat: oropharynx clear, tonsils absent, uvula midline Neck: supple without LAD Lungs: CTA bilaterally without adventitious breath sounds; cough mild Heart: regular rate and rhythm.   Skin: warm and dry Psychological: alert and cooperative; normal mood and affect  ASSESSMENT & PLAN:  1. Viral URI with cough   2. Laryngitis     Meds ordered this encounter  Medications  . magic mouthwash w/lidocaine SOLN    Sig: Take 5 mLs by mouth 3 (three) times daily as needed for mouth pain.    Dispense:  60 mL    Refill:  0    Order Specific Question:   Supervising Provider    Answer:   Eustace Moore [[id number]]  . benzonatate (TESSALON) 100 MG capsule    Sig: Take 1 capsule (100 mg total) by mouth every 8 (eight) hours.    Dispense:  21 capsule    Refill:  0    Order Specific Question:   Supervising Provider    Answer:   Eustace Moore [[id number]]   Get plenty of rest and push fluids Use OTC zyrtec and flonase for sinus congestion and runny nose Tessalon perles prescribed for cough Magic mouthwash prescribed.  This is an oral solution you can swish, and gargle as needed for symptomatic relief of sore throat.  Do not exceed 8 doses in a 24 hour period.  Do not use prior to eating, as this will numb your entire mouth.   Drink warm or cool liquids, use throat lozenges, or popsicles to help alleviate symptoms Take OTC ibuprofen or tylenol as needed for pain Follow up with PCP if symptoms persists Return or go to ER if patient has any new or worsening symptoms such as fever, chills, nausea, vomiting, worsening sore throat, cough, abdominal pain, chest pain, changes in bowel or bladder habits, etc...  Reviewed  expectations re: course of current medical issues. Questions answered. Outlined signs and symptoms indicating need for more acute intervention. Patient verbalized understanding. After Visit Summary given.        Rennis Harding, PA-C 01/02/20 2002

## 2020-01-02 NOTE — Discharge Instructions (Signed)
Get plenty of rest and push fluids Use OTC zyrtec and flonase for sinus congestion and runny nose Tessalon perles prescribed for cough Magic mouthwash prescribed.  This is an oral solution you can swish, and gargle as needed for symptomatic relief of sore throat.  Do not exceed 8 doses in a 24 hour period.  Do not use prior to eating, as this will numb your entire mouth.   Drink warm or cool liquids, use throat lozenges, or popsicles to help alleviate symptoms Take OTC ibuprofen or tylenol as needed for pain Follow up with PCP if symptoms persists Return or go to ER if patient has any new or worsening symptoms such as fever, chills, nausea, vomiting, worsening sore throat, cough, abdominal pain, chest pain, changes in bowel or bladder habits, etc..Marland Kitchen

## 2020-01-02 NOTE — ED Triage Notes (Signed)
Runny nose, congestion, sore throat,  Exposure to strep, pt does not have tonsils.

## 2020-08-21 ENCOUNTER — Encounter: Payer: Self-pay | Admitting: Internal Medicine

## 2020-08-21 ENCOUNTER — Other Ambulatory Visit: Payer: Self-pay

## 2020-08-21 ENCOUNTER — Ambulatory Visit: Payer: Medicare HMO | Admitting: Internal Medicine

## 2020-08-21 VITALS — BP 118/72 | HR 92 | Temp 97.3°F | Ht 71.5 in | Wt 231.0 lb

## 2020-08-21 DIAGNOSIS — R0600 Dyspnea, unspecified: Secondary | ICD-10-CM

## 2020-08-21 DIAGNOSIS — Z8616 Personal history of COVID-19: Secondary | ICD-10-CM | POA: Diagnosis not present

## 2020-08-21 DIAGNOSIS — B948 Sequelae of other specified infectious and parasitic diseases: Secondary | ICD-10-CM | POA: Diagnosis not present

## 2020-08-21 DIAGNOSIS — R0789 Other chest pain: Secondary | ICD-10-CM | POA: Diagnosis not present

## 2020-08-21 NOTE — Addendum Note (Signed)
Addended by: Demetrio Lapping E on: 08/21/2020 04:31 PM   Modules accepted: Orders

## 2020-08-21 NOTE — Patient Instructions (Addendum)
ICD-10-CM   1. Personal history of COVID-19  Z86.16   2. Left-sided chest wall pain  R07.89   3. Persistent dyspnea after COVID-19  R06.00    B94.8     Glad you did not get hospitalized for Covid  Need to understand if you shortness of breath and chest pain after Covid is related to sequela from acute Covid or you have developed Covid long-haul issue  Plan -Check blood ANA, RF, CBC, chemistry, liver function test, CRP, ESR, D-dimer, BNP , Quantifferon Gold and troponin -Based on the results we will get chest x-ray versus CT scan of the chest and echo -You might need pulmonary function test if symptoms continue to persist in the next few to several weeks [90-day moratorium on breathing test after Covid] -Monitor oxygen levels and symptoms at home: Any concern go to the emergency department  Follow-up -Await results n the next 24-48 hours with blood test results -Return to see Dr. Chase Caller or nurse practitioner in 4 weeks to report progress

## 2020-08-21 NOTE — Addendum Note (Signed)
Addended by: Sandra Cockayne on: 08/21/2020 04:27 PM   Modules accepted: Orders

## 2020-08-21 NOTE — Progress Notes (Signed)
OV 08/21/2020  Subjective:  Patient ID: Jenny Combs, female , DOB: 07-14-1970 , age 50 y.o. , MRN: [id number] , ADDRESS: [place] [id number] PCP [place], [place] Patient Care Team: Inda Merlin, [facility] as PCP - General  This Provider for this visit: Treatment Team:  Attending Provider: Brand Males, MD    08/21/2020 -   Chief Complaint  Patient presents with  . Consult    Pleurisy, Covid 08/01/20, infusion on 08/04/20. Left lung pain     HPI Jenny Combs 50 y.o. -is a diabetic and status post fasciotomy times multiple of her right lower extremity.  She has sole custody of her 3 grandchildren ages 57, 39-65 and 33 years old.  The 52 year old ended up getting Covid from the school and then there was an outbreak within the house.  She ended up getting COVID-19 despite having vaccine and booster.  She is frustrated by this because she is taken extreme precaution over the last 2 years social distancing and isolating and doing all the right things.  She says that she has been so isolated that she is actually gained weight 30 pounds.  After this at [facility] she ended up getting monoclonal antibody infusion Sotrovimab _0  on 08/03/2020.  The day after that she did have some rash on her lower extremity thigh area.  Her husband thought it might be related to using electric blanket.  She thought it might be related to infusion but after that it settled down.  She said during Covid she started noticing very mild left mammary infra axillary and infrascapular area chest pain.  This past 1 week it has progressed significantly and it is constant.  Severity ranges between 6 and 10.  Husband thinks severity is between 8 and 12 on a 10 point scale.  It feels like somebody squeezing her lungs.  There is no specific aggravating or relieving factors or radiation.  No diaphoresis.  Associated with shortness of breath.  She saw primary care  yesterday and was informed that she had pleurisy.  Does not recollect any blood work or chest x-ray.  She is worried about DVT/PE and therefore presented here.  No fever or chills.  No prior autoimmune disease or lung disease or cardiovascular disease.  Echocardiogram per history 1 year ago was normal.  No orthopnea proximal nocturnal dyspnea.          has a past medical history of Arthritis, Back pain, Bipolar 1 disorder ([place]), Diabetes mellitus without complication ([place]), Fibromyalgia, and PTSD (post-traumatic stress disorder).   reports that she has never smoked. She has never used smokeless tobacco.  Past Surgical History:  Procedure Laterality Date  . CESAREAN SECTION    . COMPARTMENT SYNDROME MEASUREMENT      Allergies  Allergen Reactions  . Atorvastatin Other (See Comments)    Patient reports having Palpitations, shortness of breath, and chest tightness.   . Ibuprofen Nausea Only    Immunization History  Administered Date(s) Administered  . Hepatitis A, Adult 03/31/2017  . Hepatitis B, adult 03/31/2017  . Influenza,inj,Quad PF,6+ Mos 03/31/2017  . Pneumococcal Polysaccharide-23 09/10/2015  . Tdap 08/19/2015    Family History  Problem Relation Age of Onset  . Diabetes Mother   . Cancer Mother   . Hypertension Mother   . Diabetes Father   . Cancer Father   . Hypertension Father      Current Outpatient Medications:  .  Aspirin-Acetaminophen-Caffeine (GOODY HEADACHE PO), Take 1 each by mouth 3 (three) times daily as needed (headaches)., Disp: , Rfl:  .  benzonatate (TESSALON) 100 MG capsule, Take 1 capsule (100 mg total) by mouth every 8 (eight) hours., Disp: 21 capsule, Rfl: 0 .  emtricitabine-tenofovir (TRUVADA) 200-300 MG tablet, Take 1 tablet by mouth daily., Disp: 30 tablet, Rfl: 2 .  HYDROcodone-acetaminophen (NORCO/VICODIN) 5-325 MG per tablet, Take 1 tablet by mouth every 6 (six) hours as needed., Disp: 10 tablet, Rfl: 0 .  magic mouthwash w/lidocaine  SOLN, Take 5 mLs by mouth 3 (three) times daily as needed for mouth pain., Disp: 60 mL, Rfl: 0 .  metFORMIN (GLUCOPHAGE) 500 MG tablet, Take 500 mg by mouth 2 (two) times daily with a meal., Disp: , Rfl:  .  ondansetron (ZOFRAN) 4 MG tablet, Take 1 tablet (4 mg total) by mouth every 6 (six) hours as needed for nausea., Disp: 12 tablet, Rfl: 0 .  oxyCODONE-acetaminophen (PERCOCET) 10-325 MG tablet, Take 1 tablet by mouth every 4 (four) hours as needed for pain., Disp: , Rfl:  .  PARoxetine (PAXIL) 10 MG tablet, Take 10 mg by mouth daily., Disp: , Rfl:  .  traMADol (ULTRAM) 50 MG tablet, Take 50 mg by mouth every 6 (six) hours as needed for moderate pain., Disp: , Rfl:       Objective:   Vitals:   08/21/20 1502  BP: 118/72  Pulse: 92  Temp: (!) 97.3 F (36.3 C)  TempSrc: Oral  SpO2: 96%  Weight: 231 lb (104.8 kg)  Height: 5' 11.5" (1.816 m)    Estimated body mass index is 31.77 kg/m as calculated from the following:   Height as of this encounter: 5' 11.5" (1.816 m).   Weight as of this encounter: 231 lb (104.8 kg).  _0 @  [employer]   08/21/20 1502  Weight: 231 lb (104.8 kg)     Physical Exam  General Appearance:    Alert, cooperative, no distress, appears stated age - yes , Deconditioned looking - no , OBESE  - mild, Sitting on Wheelchair -  no  Head:    Normocephalic, without obvious abnormality, atraumatic  Eyes:    PERRL, conjunctiva/corneas clear,  Ears:    Normal TM's and external ear canals, both ears  Nose:   Nares normal, septum midline, mucosa normal, no drainage    or sinus tenderness. OXYGEN ON  - no . Patient is @ ra   Throat:   Lips, mucosa, and tongue normal; teeth and gums normal. Cyanosis on lips - no  Neck:   Supple, symmetrical, trachea midline, no adenopathy;    thyroid:  no enlargement/tenderness/nodules; no carotid   bruit or JVD  Back:     Symmetric, no curvature, ROM normal, no CVA tenderness  Lungs:     Distress - no , Wheeze no,  Barrell Chest - no, Purse lip breathing - no, Crackles - no   Chest Wall:    No tenderness or deformity.    Heart:    Regular rate and rhythm, S1 and S2 normal, no rub   or gallop, Murmur - no  Breast Exam:    NOT DONE  Abdomen:     Soft, non-tender, bowel sounds active all four quadrants,    no masses, no organomegaly. Visceral obesity - yes  Genitalia:   NOT DONE  Rectal:   NOT DONE  Extremities:   Extremities - normal, Has Cane - no, Clubbing - no, Edema - no  Pulses:   2+ and symmetric all extremities  Skin:   Stigmata of Connective Tissue Disease - no  Lymph nodes:   Cervical, supraclavicular, and axillary nodes normal  Psychiatric:  Neurologic:   Pleasant - yes, Anxious - no, Flat affect - no  CAm-ICU - neg, Alert and Oriented x 3 - yes, Moves all 4s - yes, Speech - normal, Cognition - intact         Assessment:       ICD-10-CM   1. Personal history of COVID-19  Z86.16   2. Left-sided chest wall pain  R07.89   3. Persistent dyspnea after COVID-19  R06.00    B94.8        Plan:     Patient Instructions     ICD-10-CM   1. Personal history of COVID-19  Z86.16   2. Left-sided chest wall pain  R07.89   3. Persistent dyspnea after COVID-19  R06.00    B94.8     Glad you did not get hospitalized for Covid  Need to understand if you shortness of breath and chest pain after Covid is related to sequela from acute Covid or you have developed Covid long-haul issue  Plan -Check blood ANA, RF, CBC, chemistry, liver function test, CRP, ESR, D-dimer, BNP , Quantifferon Gold and troponin -Based on the results we will get chest x-ray versus CT scan of the chest and echo -You might need pulmonary function test if symptoms continue to persist in the next few to several weeks [90-day moratorium on breathing test after Covid] -Monitor oxygen levels and symptoms at home: Any concern go to the emergency department  Follow-up -Await results n the next 24-48 hours with blood test  results -Return to see Dr. Chase Caller or nurse practitioner in 4 weeks to report progress     SIGNATURE    Dr. Brand Males, M.D., F.C.C.P,  Pulmonary and Critical Care Medicine Staff Physician, [place] Director - Interstitial Lung Disease  Program  Pulmonary [place] at [place], [place], [address]  Pager: [phone], If no answer or between  15:00h - 7:00h: call [phone] Telephone: [phone]  4:15 PM 08/21/2020

## 2020-08-22 ENCOUNTER — Telehealth: Payer: Self-pay | Admitting: Internal Medicine

## 2020-08-22 DIAGNOSIS — R0600 Dyspnea, unspecified: Secondary | ICD-10-CM

## 2020-08-22 DIAGNOSIS — Z8616 Personal history of COVID-19: Secondary | ICD-10-CM

## 2020-08-22 DIAGNOSIS — B948 Sequelae of other specified infectious and parasitic diseases: Secondary | ICD-10-CM

## 2020-08-22 LAB — COMPREHENSIVE METABOLIC PANEL
ALT: 22 U/L (ref 0–35)
AST: 19 U/L (ref 0–37)
Albumin: 3.8 g/dL (ref 3.5–5.2)
Alkaline Phosphatase: 30 U/L — ABNORMAL LOW (ref 39–117)
BUN: 13 mg/dL (ref 6–23)
CO2: 31 mEq/L (ref 19–32)
Calcium: 9.5 mg/dL (ref 8.4–10.5)
Chloride: 105 mEq/L (ref 96–112)
Creatinine, Ser: 0.79 mg/dL (ref 0.40–1.20)
GFR: 86.43 mL/min (ref >60.00)
Glucose, Bld: 160 mg/dL — ABNORMAL HIGH (ref 70–99)
Potassium: 4.4 mEq/L (ref 3.5–5.1)
Sodium: 139 mEq/L (ref 135–145)
Total Bilirubin: 0.4 mg/dL (ref 0.2–1.2)
Total Protein: 6.5 g/dL (ref 6.0–8.3)

## 2020-08-22 LAB — CBC WITH DIFFERENTIAL/PLATELET
Basophils Absolute: 0 10*3/uL (ref 0.0–0.1)
Basophils Relative: 0.4 % (ref 0.0–3.0)
Eosinophils Absolute: 0.3 10*3/uL (ref 0.0–0.7)
Eosinophils Relative: 3.7 % (ref 0.0–5.0)
HCT: 37.7 % (ref 36.0–46.0)
Hemoglobin: 12.4 g/dL (ref 12.0–15.0)
Lymphocytes Relative: 33.1 % (ref 12.0–46.0)
Lymphs Abs: 2.9 10*3/uL (ref 0.7–4.0)
MCHC: 33 g/dL (ref 30.0–36.0)
MCV: 89.7 fl (ref 78.0–100.0)
Monocytes Absolute: 0.5 10*3/uL (ref 0.1–1.0)
Monocytes Relative: 5.3 % (ref 3.0–12.0)
Neutro Abs: 5 10*3/uL (ref 1.4–7.7)
Neutrophils Relative %: 57.5 % (ref 43.0–77.0)
Platelets: 214 10*3/uL (ref 150.0–400.0)
RBC: 4.2 Mil/uL (ref 3.87–5.11)
RDW: 13.3 % (ref 11.5–15.5)
WBC: 8.7 10*3/uL (ref 4.0–10.5)

## 2020-08-22 LAB — C-REACTIVE PROTEIN: CRP: <1 mg/dL (ref 0.5–20.0)

## 2020-08-22 LAB — SEDIMENTATION RATE: Sed Rate: 8 mm/hr (ref 0–30)

## 2020-08-22 LAB — HEPATIC FUNCTION PANEL
ALT: 22 U/L (ref 0–35)
AST: 19 U/L (ref 0–37)
Albumin: 3.8 g/dL (ref 3.5–5.2)
Alkaline Phosphatase: 30 U/L — ABNORMAL LOW (ref 39–117)
Bilirubin, Direct: 0.1 mg/dL (ref 0.0–0.3)
Total Bilirubin: 0.4 mg/dL (ref 0.2–1.2)
Total Protein: 6.5 g/dL (ref 6.0–8.3)

## 2020-08-22 LAB — BRAIN NATRIURETIC PEPTIDE: Pro B Natriuretic peptide (BNP): 31 pg/mL (ref 0.0–100.0)

## 2020-08-22 NOTE — Telephone Encounter (Signed)
Called and spoke with pt letting her know the results of some labwork that has come back and recs stated by MR. Pt verbalized understanding. Orders have been placed for echo and hrct. Nothing further needed.

## 2020-08-22 NOTE — Telephone Encounter (Signed)
 -  ddimer normal - results given to patient yesterday  RF - normal  Rest still pending  Has unexplained post covid dyspne and chest pain  Plan   - HRCT wo contrast supine and prone  - echo 2 d  Can be done in [place]    LABS    PULMONARY No results for input(s): PHART, PCO2ART, PO2ART, HCO3, TCO2, O2SAT in the last 168 hours.  Invalid input(s): PCO2, PO2  CBC No results for input(s): HGB, HCT, WBC, PLT in the last 168 hours.  COAGULATION No results for input(s): INR in the last 168 hours.  CARDIAC  No results for input(s): TROPONINI in the last 168 hours. No results for input(s): PROBNP in the last 168 hours.   CHEMISTRY No results for input(s): NA, K, CL, CO2, GLUCOSE, BUN, CREATININE, CALCIUM, MG, PHOS in the last 168 hours. CrCl cannot be calculated (Patient's most recent lab result is older than the maximum 21 days allowed.).   LIVER No results for input(s): AST, ALT, ALKPHOS, BILITOT, PROT, ALBUMIN, INR in the last 168 hours.   INFECTIOUS No results for input(s): LATICACIDVEN, PROCALCITON in the last 168 hours.   ENDOCRINE CBG (last 3)  No results for input(s): GLUCAP in the last 72 hours.       IMAGING x48h  - image(s) personally visualized  -   highlighted in bold No results found.

## 2020-08-23 LAB — ANA: Anti Nuclear Antibody (ANA): NEGATIVE

## 2020-08-23 LAB — QUANTIFERON-TB GOLD PLUS
Mitogen-NIL: >10 IU/mL
NIL: 0.02 IU/mL
QuantiFERON-TB Gold Plus: NEGATIVE
TB1-NIL: 0 IU/mL
TB2-NIL: 0 IU/mL

## 2020-08-23 LAB — D-DIMER, QUANTITATIVE: D-Dimer, Quant: 0.36 mcg/mL FEU (ref <0.50)

## 2020-08-23 LAB — RHEUMATOID FACTOR: Rheumatoid fact SerPl-aCnc: <14 IU/mL (ref <14)

## 2020-08-25 NOTE — Progress Notes (Signed)
Post covid blood work is normal except high suagar 160mg 

## 2020-08-27 ENCOUNTER — Other Ambulatory Visit: Payer: Self-pay

## 2020-08-27 ENCOUNTER — Ambulatory Visit (HOSPITAL_COMMUNITY)
Admission: RE | Admit: 2020-08-27 | Discharge: 2020-08-27 | Disposition: A | Discharge status: Home / Self Care | Payer: Medicare Other | Source: Ambulatory Visit | Attending: Internal Medicine | Admitting: Internal Medicine

## 2020-08-27 DIAGNOSIS — U099 Post covid-19 condition, unspecified: Secondary | ICD-10-CM | POA: Diagnosis not present

## 2020-08-27 DIAGNOSIS — Z8616 Personal history of COVID-19: Secondary | ICD-10-CM

## 2020-08-27 DIAGNOSIS — B948 Sequelae of other specified infectious and parasitic diseases: Secondary | ICD-10-CM | POA: Insufficient documentation

## 2020-08-27 DIAGNOSIS — R0600 Dyspnea, unspecified: Secondary | ICD-10-CM | POA: Insufficient documentation

## 2020-08-27 NOTE — Telephone Encounter (Signed)
Message routed to Dr. Marchelle Gearing as Lorain Childes  Thank you for letting me know about the Blood work. I had lunch right before I got the call with the available appointment so that probably why my sugar was so high. I hope you have a wonderful afternoon

## 2020-08-31 NOTE — Progress Notes (Signed)
Called and went over CT results per Dr Marchelle Gearing with patient. All questions answered and patient expressed full understanding. Nothing further needed at this time.

## 2020-08-31 NOTE — Progress Notes (Signed)
Patient was very concrned about chest pain post covid. CT is normal per report  Xxx  IMPRESSION: 1. No findings to suggest interstitial lung disease. 2. No acute findings are noted to account for the patient's symptoms. 3. Mild atherosclerosis.   Electronically Signed   By: Trudie Reed M.D.   On: 08/28/2020 08:13

## 2020-09-11 ENCOUNTER — Other Ambulatory Visit: Payer: Self-pay

## 2020-09-11 ENCOUNTER — Ambulatory Visit (HOSPITAL_COMMUNITY)
Admission: RE | Admit: 2020-09-11 | Discharge: 2020-09-11 | Disposition: A | Discharge status: Home / Self Care | Payer: Medicare Other | Source: Ambulatory Visit | Attending: Internal Medicine | Admitting: Internal Medicine

## 2020-09-11 DIAGNOSIS — U099 Post covid-19 condition, unspecified: Secondary | ICD-10-CM | POA: Diagnosis not present

## 2020-09-11 DIAGNOSIS — R0600 Dyspnea, unspecified: Secondary | ICD-10-CM | POA: Diagnosis not present

## 2020-09-11 DIAGNOSIS — E119 Type 2 diabetes mellitus without complications: Secondary | ICD-10-CM | POA: Insufficient documentation

## 2020-09-11 DIAGNOSIS — B948 Sequelae of other specified infectious and parasitic diseases: Secondary | ICD-10-CM | POA: Insufficient documentation

## 2020-09-11 DIAGNOSIS — Z8616 Personal history of COVID-19: Secondary | ICD-10-CM | POA: Diagnosis not present

## 2020-09-11 LAB — ECHOCARDIOGRAM COMPLETE
Area-P 1/2: 3.46 cm2
S' Lateral: 2.3 cm

## 2020-09-11 NOTE — Progress Notes (Signed)
*  PRELIMINARY RESULTS* Echocardiogram 2D Echocardiogram has been performed.  Stacey Drain 09/11/2020, 11:20 AM

## 2020-09-23 NOTE — Progress Notes (Signed)
Echo is normal. Post covid . So please let her know because is important normal results

## 2021-06-08 ENCOUNTER — Encounter (HOSPITAL_COMMUNITY): Payer: Self-pay | Admitting: *Deleted

## 2021-06-08 ENCOUNTER — Emergency Department (HOSPITAL_COMMUNITY)
Admission: EM | Admit: 2021-06-08 | Discharge: 2021-06-09 | Disposition: A | Discharge status: Home / Self Care | Payer: Medicare Other | Attending: Emergency Medicine | Admitting: Emergency Medicine

## 2021-06-08 ENCOUNTER — Emergency Department (HOSPITAL_COMMUNITY): Payer: Medicare Other

## 2021-06-08 DIAGNOSIS — Z20822 Contact with and (suspected) exposure to covid-19: Secondary | ICD-10-CM | POA: Diagnosis not present

## 2021-06-08 DIAGNOSIS — Z7984 Long term (current) use of oral hypoglycemic drugs: Secondary | ICD-10-CM | POA: Diagnosis not present

## 2021-06-08 DIAGNOSIS — R0602 Shortness of breath: Secondary | ICD-10-CM | POA: Insufficient documentation

## 2021-06-08 DIAGNOSIS — R0789 Other chest pain: Secondary | ICD-10-CM | POA: Insufficient documentation

## 2021-06-08 DIAGNOSIS — E119 Type 2 diabetes mellitus without complications: Secondary | ICD-10-CM | POA: Diagnosis not present

## 2021-06-08 LAB — BASIC METABOLIC PANEL
Anion gap: 6 (ref 5–15)
BUN: 12 mg/dL (ref 6–20)
CO2: 24 mmol/L (ref 22–32)
Calcium: 9.4 mg/dL (ref 8.9–10.3)
Chloride: 106 mmol/L (ref 98–111)
Creatinine, Ser: 0.83 mg/dL (ref 0.44–1.00)
GFR, Estimated: >60 mL/min (ref >60)
Glucose, Bld: 183 mg/dL — ABNORMAL HIGH (ref 70–99)
Potassium: 3.7 mmol/L (ref 3.5–5.1)
Sodium: 136 mmol/L (ref 135–145)

## 2021-06-08 LAB — CBC
HCT: 38.6 % (ref 36.0–46.0)
Hemoglobin: 12.7 g/dL (ref 12.0–15.0)
MCH: 30.5 pg (ref 26.0–34.0)
MCHC: 32.9 g/dL (ref 30.0–36.0)
MCV: 92.8 fL (ref 80.0–100.0)
Platelets: 215 10*3/uL (ref 150–400)
RBC: 4.16 MIL/uL (ref 3.87–5.11)
RDW: 12.4 % (ref 11.5–15.5)
WBC: 10.5 10*3/uL (ref 4.0–10.5)
nRBC: 0 % (ref 0.0–0.2)

## 2021-06-08 LAB — TROPONIN I (HIGH SENSITIVITY)
Troponin I (High Sensitivity): 2 ng/L (ref <18)
Troponin I (High Sensitivity): <2 ng/L (ref <18)

## 2021-06-08 LAB — RESP PANEL BY RT-PCR (FLU A&B, COVID) ARPGX2
Influenza A by PCR: NEGATIVE
Influenza B by PCR: NEGATIVE
SARS Coronavirus 2 by RT PCR: NEGATIVE

## 2021-06-08 LAB — D-DIMER, QUANTITATIVE: D-Dimer, Quant: 0.69 ug/mL-FEU — ABNORMAL HIGH (ref 0.00–0.50)

## 2021-06-08 LAB — POC URINE PREG, ED: Preg Test, Ur: NEGATIVE

## 2021-06-08 MED ORDER — ASPIRIN 325 MG PO TABS
325.0000 mg | ORAL_TABLET | Freq: Once | ORAL | Status: AC
  Administered 2021-06-08: 325 mg via ORAL
  Filled 2021-06-08: qty 1

## 2021-06-08 NOTE — ED Triage Notes (Signed)
Chest pain onset 45 minutes ago, c/o shortness of breath, left arm numbness

## 2021-06-08 NOTE — ED Notes (Signed)
Called [facility] at this time for transport to [facility] for ct. Jenny Combs

## 2021-06-08 NOTE — ED Provider Notes (Signed)
[facility] EMERGENCY DEPARTMENT Provider Note   CSN: [id number] Arrival date & time: 06/08/21  1825     History Chief Complaint  Patient presents with   Chest Pain    Jenny Combs is a 50 y.o. female.  HPI  Patient with medical history including arthritis, back pain, diabetes, fibromyalgia presents with complaints of chest pain.  Patient states chest pain started today after she moving her daughter into her new house.  She states that as she was vacuuming she felt some numbness and tingling in her arm and her face, she states she had slight pain in her chest, she states that she stopped vacuuming and then got in the car and was driving home about 10 minutes in the car she was having worsening chest pain.  She states she felt slightly short of breath did not become diaphoretic, no nausea, vomiting, lightheaded dizziness.  Patient states that the pain remains in her chest, does not radiate, describes as a squeezing-like sensation, she denies any peripheral edema, orthopnea, she denies pleuritic chest pain.  She has no significant cardiac history, but has diabetes, hyperlipidemia, family history of MIs.  She has no history of PEs or DVTs currently not on hormone therapy.  She is not taking anything for pain, has no other complaints, position and/or p.o. intake does not increase or decrease the pain.  She has no other complaints at this time.  Past Medical History:  Diagnosis Date   Arthritis    Back pain    Bipolar 1 disorder ([place])    Diabetes mellitus without complication (HCC)    Fibromyalgia    PTSD (post-traumatic stress disorder)     There are no problems to display for this patient.   Past Surgical History:  Procedure Laterality Date   CESAREAN SECTION     COMPARTMENT SYNDROME MEASUREMENT       OB History   No obstetric history on file.     Family History  Problem Relation Age of Onset   Diabetes Mother    Cancer Mother    Hypertension Mother    Diabetes Father     Cancer Father    Hypertension Father     Social History   Tobacco Use   Smoking status: Never   Smokeless tobacco: Never  Vaping Use   Vaping Use: Never used  Substance Use Topics   Alcohol use: No   Drug use: Never    Home Medications Prior to Admission medications   Medication Sig Start Date End Date Taking? Authorizing Provider  Aspirin-Acetaminophen-Caffeine (GOODY HEADACHE PO) Take 1 each by mouth 3 (three) times daily as needed (headaches).    [provider]  benzonatate (TESSALON) 100 MG capsule Take 1 capsule (100 mg total) by mouth every 8 (eight) hours. 01/02/20   Wurst, [place], PA-C  emtricitabine-tenofovir (TRUVADA) 200-300 MG tablet Take 1 tablet by mouth daily. 04/02/17   Kuppelweiser, Cassie L, RPH-CPP  HYDROcodone-acetaminophen (NORCO/VICODIN) 5-325 MG per tablet Take 1 tablet by mouth every 6 (six) hours as needed. 01/30/15   Montine Circle, PA-C  magic mouthwash w/lidocaine SOLN Take 5 mLs by mouth 3 (three) times daily as needed for mouth pain. 01/02/20   Wurst, [place], PA-C  metFORMIN (GLUCOPHAGE) 500 MG tablet Take 500 mg by mouth 2 (two) times daily with a meal.    [provider]  ondansetron (ZOFRAN) 4 MG tablet Take 1 tablet (4 mg total) by mouth every 6 (six) hours as needed for nausea. 09/19/18  Loren Racer, MD  oxyCODONE-acetaminophen (PERCOCET) 10-325 MG tablet Take 1 tablet by mouth every 4 (four) hours as needed for pain.    [provider]  PARoxetine (PAXIL) 10 MG tablet Take 10 mg by mouth daily.    [provider]  traMADol (ULTRAM) 50 MG tablet Take 50 mg by mouth every 6 (six) hours as needed for moderate pain.    [provider]    Allergies    Atorvastatin and Ibuprofen  Review of Systems   Review of Systems  Constitutional:  Negative for chills and fever.  HENT:  Negative for congestion.   Respiratory:  Positive for shortness of breath.   Cardiovascular:  Positive for chest pain.  Negative for palpitations and leg swelling.  Gastrointestinal:  Negative for abdominal pain, diarrhea, nausea and vomiting.  Genitourinary:  Negative for enuresis.  Musculoskeletal:  Negative for back pain.  Skin:  Negative for rash.  Neurological:  Negative for dizziness.  Hematological:  Does not bruise/bleed easily.   Physical Exam Updated Vital Signs BP 138/70   Pulse 88   Temp 98 F (36.7 C)   Resp 19   SpO2 100%   Physical Exam Vitals and nursing note reviewed.  Constitutional:      General: She is not in acute distress.    Appearance: She is not ill-appearing.  HENT:     Head: Normocephalic and atraumatic.     Nose: No congestion.  Eyes:     Conjunctiva/sclera: Conjunctivae normal.  Cardiovascular:     Rate and Rhythm: Normal rate and regular rhythm.     Pulses: Normal pulses.     Heart sounds: No murmur heard.   No friction rub. No gallop.  Pulmonary:     Effort: No respiratory distress.     Breath sounds: No wheezing, rhonchi or rales.     Comments: Chest pain was reproducible, she is also tender on her left scapula.  Chest:     Chest wall: Tenderness present.  Abdominal:     Palpations: Abdomen is soft.     Tenderness: There is no abdominal tenderness. There is no right CVA tenderness or left CVA tenderness.  Musculoskeletal:     Right lower leg: No edema.     Left lower leg: No edema.  Skin:    General: Skin is warm and dry.  Neurological:     Mental Status: She is alert.  Psychiatric:        Mood and Affect: Mood normal.    ED Results / Procedures / Treatments   Labs (all labs ordered are listed, but only abnormal results are displayed) Labs Reviewed  BASIC METABOLIC PANEL - Abnormal; Notable for the following components:      Result Value   Glucose, Bld 183 (*)    All other components within normal limits  D-DIMER, QUANTITATIVE - Abnormal; Notable for the following components:   D-Dimer, Quant 0.69 (*)    All other components within normal  limits  RESP PANEL BY RT-PCR (FLU A&B, COVID) ARPGX2  CBC  POC URINE PREG, ED  TROPONIN I (HIGH SENSITIVITY)  TROPONIN I (HIGH SENSITIVITY)    EKG EKG Interpretation  Date/Time:  Saturday June 08 2021 18:40:30 EST Ventricular Rate:  93 PR Interval:  148 QRS Duration: 87 QT Interval:  350 QTC Calculation: 436 R Axis:   25 Text Interpretation: Sinus rhythm Confirmed by Benjiman Core [id number]) on 06/08/2021 11:09:56 PM  Radiology DG Chest Portable 1 View  Result Date: 06/08/2021  CLINICAL DATA:  Chest pain, left arm numbness, short of breath EXAM: PORTABLE CHEST 1 VIEW COMPARISON:  09/19/2018 FINDINGS: The heart size and mediastinal contours are within normal limits. Both lungs are clear. The visualized skeletal structures are unremarkable. IMPRESSION: No active disease. Electronically Signed   By: Randa Ngo M.D.   On: 06/08/2021 19:42    Procedures Procedures   Medications Ordered in ED Medications  aspirin tablet 325 mg (325 mg Oral Given 06/08/21 2005)    ED Course  I have reviewed the triage vital signs and the nursing notes.  Pertinent labs & imaging results that were available during my care of the patient were reviewed by me and considered in my medical decision making (see chart for details).    MDM Rules/Calculators/A&P                          Initial impression-presents with left-sided chest pain.  She is alert, no acute distress, vital signs notable for tachycardia and tachypnea.  Does her sudden onset of chest pain shortness of breath with tachypnea and tachycardia we will add on D-dimer for rule out PE, will obtain basic lab work-up prior with aspirin and reassess.  Work-up-CBC is unremarkable, BMP shows glucose of 183, D-dimer elevated 0.69, negative delta troponins, urine pregnancy negative.  Chest x-ray unremarkable, EKG sinus rhythm without signs of ischemia.  Reassessment-patient has a negative delta troponin but does have an elevated D-dimer,  recommend CTA of chest for rule of PE, unfortunately do not have CT scan at this time but will transfer patient down to [facility] for scan, she is agreement this plan.   Consult-spoke with Dr. Dene Gentry who will accept the patient in transfer.  Rule out- I have low suspicion for ACS as history is atypical, patient has no cardiac history, EKG was sinus rhythm without signs of ischemia, patient had a delta troponin.   Low suspicion for AAA or aortic dissection as history is atypical, patient has low risk factors.  Low suspicion for CHF exacerbation no pleural effusions, no peripheral edema, presentation atypical etiology.  Low suspicion for pericarditis or myocarditis as pain is not worsened with movement, there is no functional of present my exam, patient has low risk factors for this.  Low suspicion for systemic infection as patient is nontoxic-appearing, vital signs reassuring, no obvious source infection noted on exam.   Plan-patient will be transferred to [facility] for CT of chest rule out PE, if negative and patient is chest pain-free patient can be discharged home follow-up with cardiology for further evaluation.  Final Clinical Impression(s) / ED Diagnoses Final diagnoses:  Atypical chest pain    Rx / DC Orders ED Discharge Orders     None        Marcello Fennel, PA-C 06/08/21 2314    Davonna Belling, MD 06/09/21 0005

## 2021-06-09 ENCOUNTER — Encounter (HOSPITAL_COMMUNITY): Payer: Self-pay

## 2021-06-09 ENCOUNTER — Emergency Department (HOSPITAL_COMMUNITY): Payer: Medicare Other

## 2021-06-09 DIAGNOSIS — R0602 Shortness of breath: Secondary | ICD-10-CM | POA: Diagnosis not present

## 2021-06-09 DIAGNOSIS — Z20822 Contact with and (suspected) exposure to covid-19: Secondary | ICD-10-CM | POA: Diagnosis not present

## 2021-06-09 DIAGNOSIS — E119 Type 2 diabetes mellitus without complications: Secondary | ICD-10-CM | POA: Diagnosis not present

## 2021-06-09 DIAGNOSIS — R0789 Other chest pain: Secondary | ICD-10-CM | POA: Diagnosis present

## 2021-06-09 DIAGNOSIS — Z7984 Long term (current) use of oral hypoglycemic drugs: Secondary | ICD-10-CM | POA: Diagnosis not present

## 2021-06-09 MED ORDER — IOHEXOL 350 MG/ML SOLN
75.0000 mL | Freq: Once | INTRAVENOUS | Status: AC | PRN
  Administered 2021-06-09: 75 mL via INTRAVENOUS

## 2021-06-09 NOTE — ED Provider Notes (Signed)
Pt here as a transfer from [facility] for CTA chest.   CTA chest neg for PE.  Pt said she's recently started on Crestor.  She had similar sx when she took Atorvastatin.  Pt is told to call her pcp and talk about whether or not to continue to take the Crestor.  Pt's cardiac work up neg.  She is stable for d/c.  Return if worse.    Jacalyn Lefevre, MD 06/09/21 [phone]

## 2021-06-09 NOTE — ED Notes (Signed)
Patient arrived from [facility]. Chest pain for a few hours. Cardiac workup negative but D dimer 0.69. Patient needs CTA.

## 2021-12-09 IMAGING — CT CT CHEST HIGH RESOLUTION W/O CM
2 of 6 series · 15 of 36 positions shown, 18 images · non-contrast
Comparison: No priors.

CLINICAL DATA: 49-year-old female with history of dyspnea on
exertion. Prior history of COVID several months ago.

EXAM:
CT CHEST WITHOUT CONTRAST
TECHNIQUE: Multidetector CT imaging of the chest was performed following the
standard protocol without intravenous contrast. High resolution
imaging of the lungs, as well as inspiratory and expiratory imaging,
was performed.

[Series 4: thorax · axial · 0.88mm/px · z∈[+1138,+1390]mm · 12 of 142 slices shown, 15 images]
[im 8/142  mediastinal]
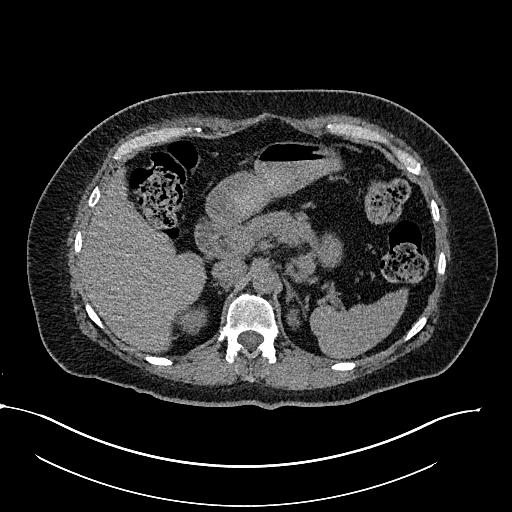
[im 8/142  lung]
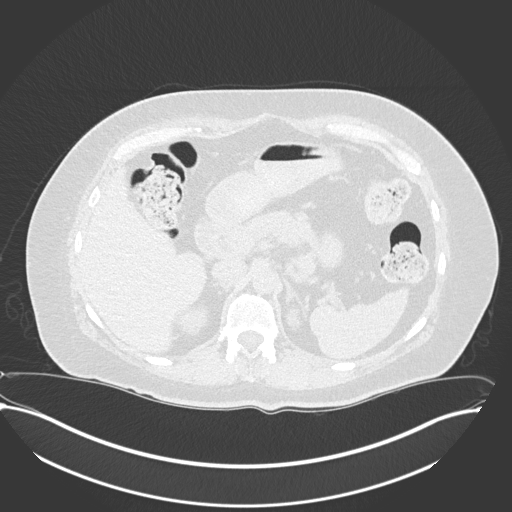
[im 24/142  lung]
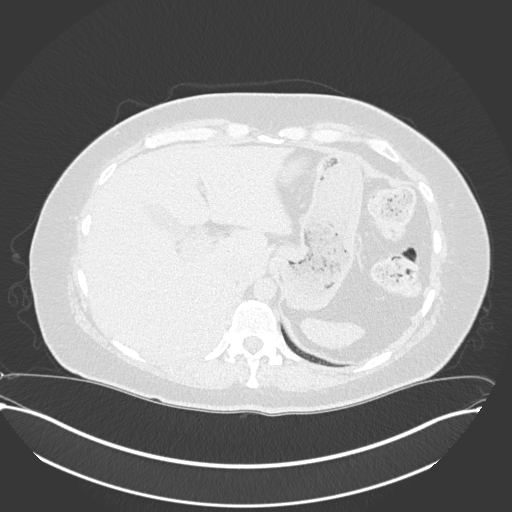
[im 32/142  lung]
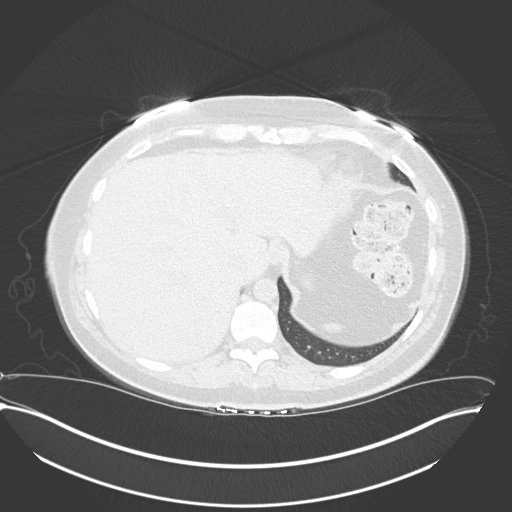
[im 40/142  lung]
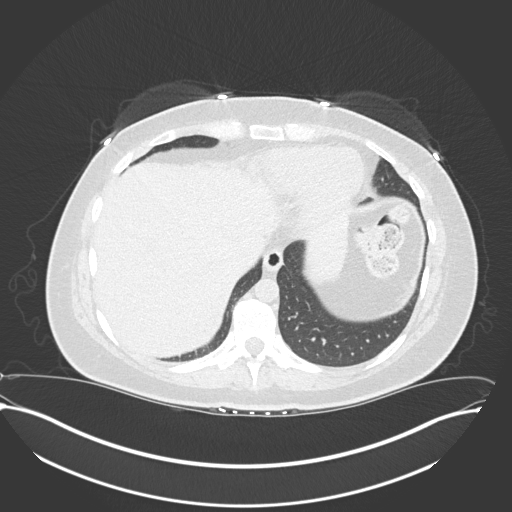
[im 55/142  mediastinal]
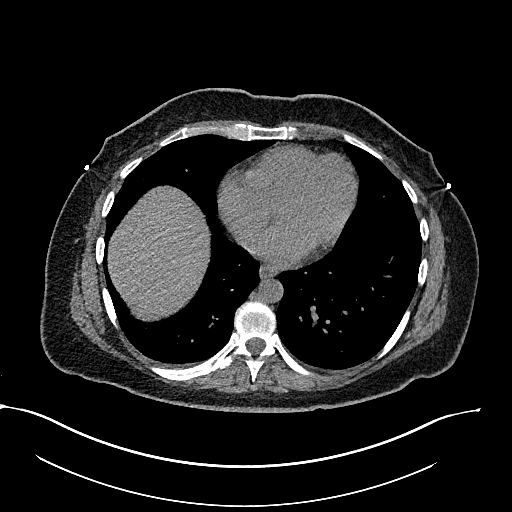
[im 55/142  lung]
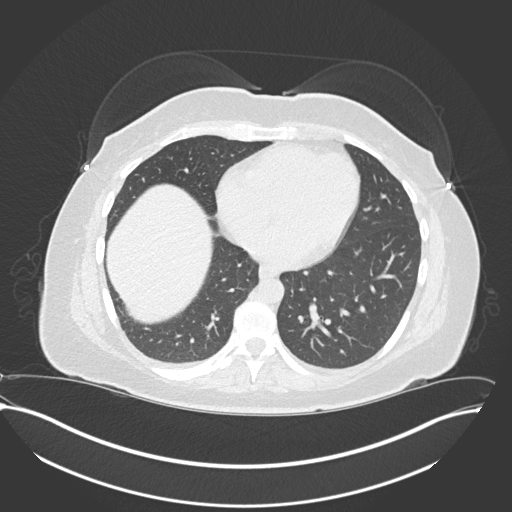
[im 63/142  lung]
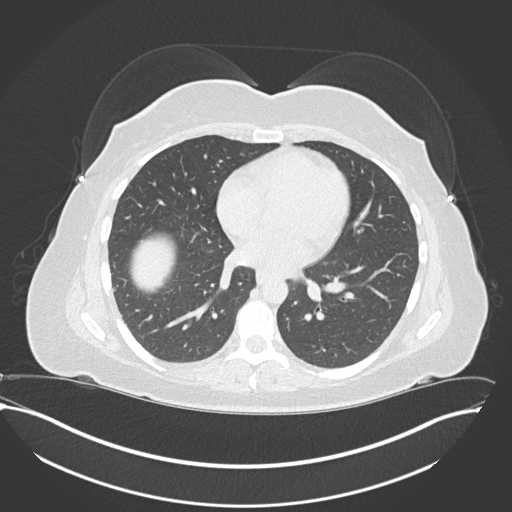
[im 79/142  lung]
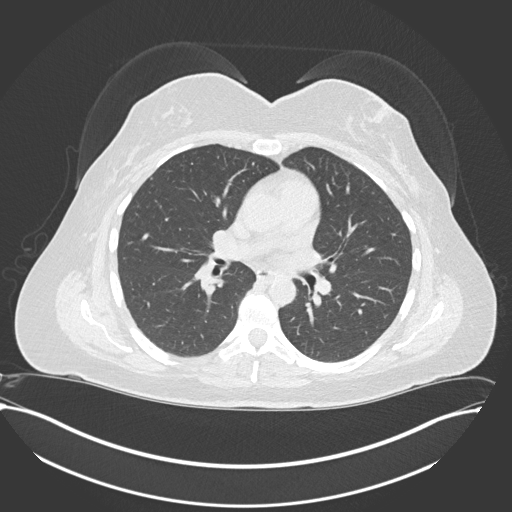
[im 87/142  lung]
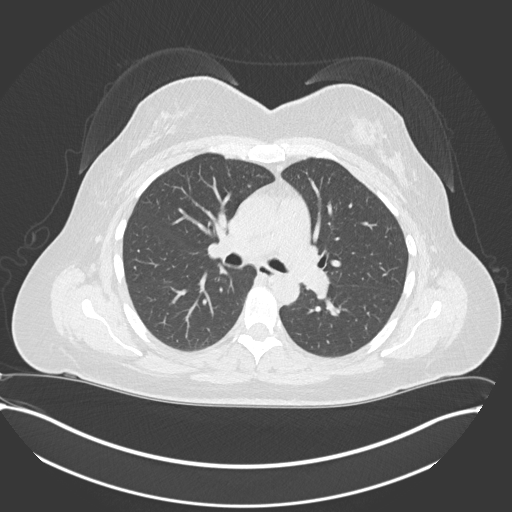
[im 102/142  mediastinal]
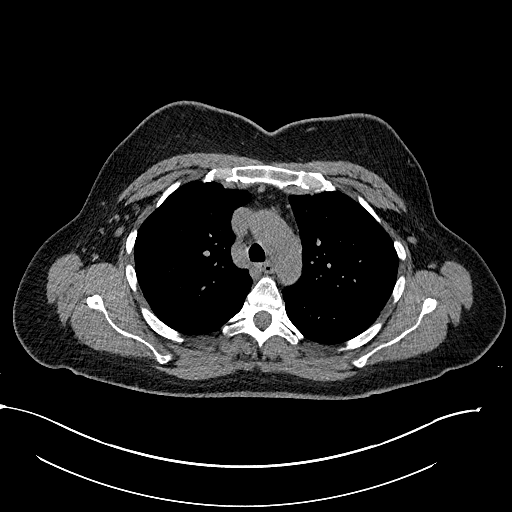
[im 102/142  lung]
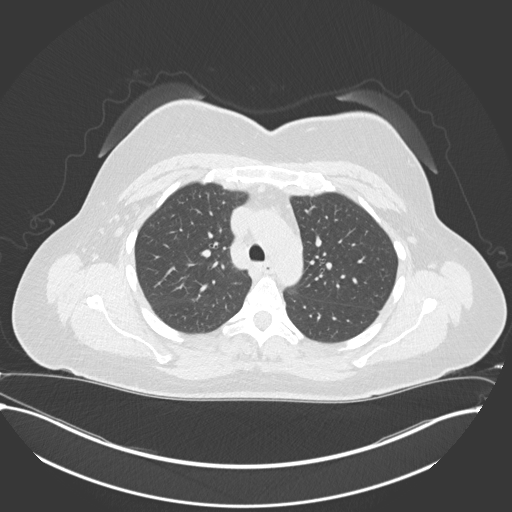
[im 110/142  lung]
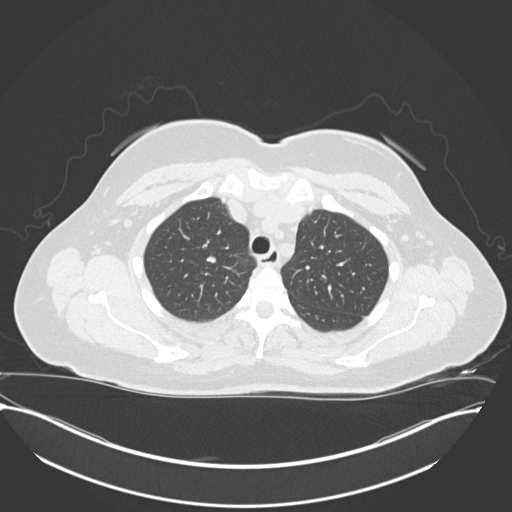
[im 118/142  lung]
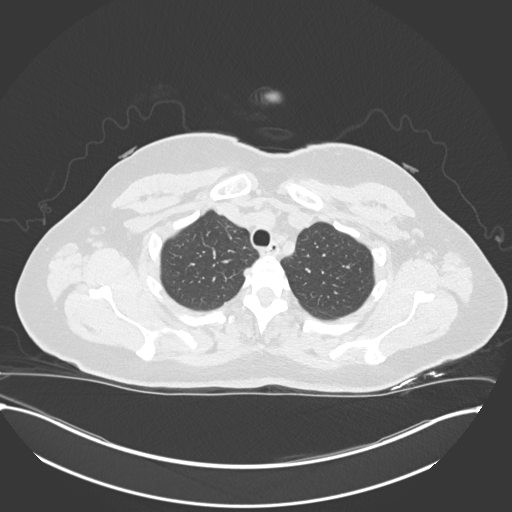
[im 134/142  lung]
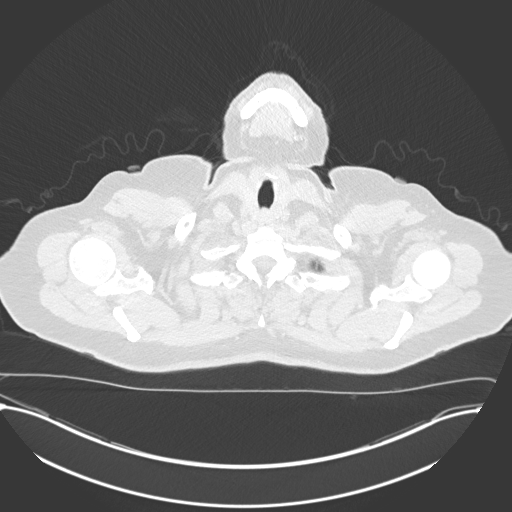

[Series 8: coronal · coronal · 0.99mm/px · 3 of 151 slices shown]
[im 31/151  lung]
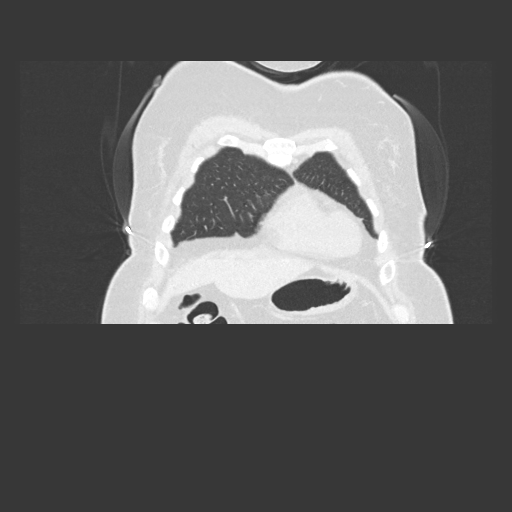
[im 61/151  lung]
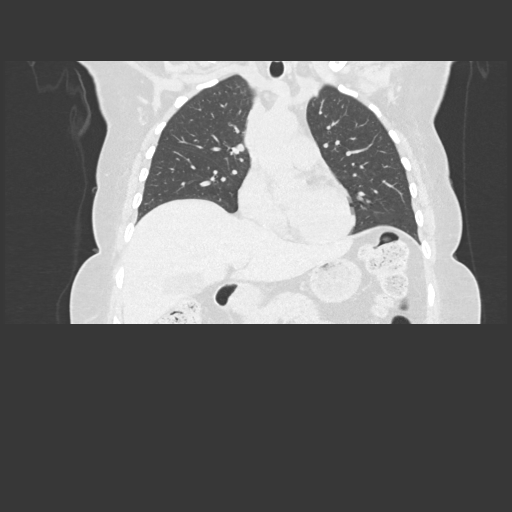
[im 91/151  lung]
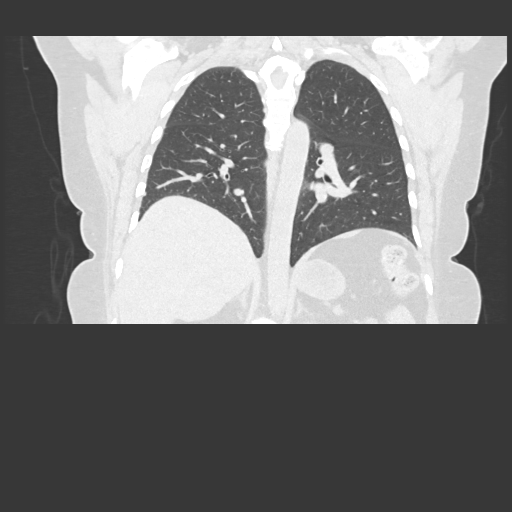

[15 of 36 positions shown; findings below may reference images not displayed]

FINDINGS: Cardiovascular: Heart size is normal. There is no significant
pericardial fluid, thickening or pericardial calcification. No
atherosclerotic calcifications are noted in the thoracic aorta or
the coronary arteries. Atherosclerosis of the proximal left
subclavian artery.

Mediastinum/Nodes: No pathologically enlarged mediastinal or hilar
lymph nodes. Please note that accurate exclusion of hilar adenopathy
is limited on noncontrast CT scans. Esophagus is unremarkable in
appearance. No axillary lymphadenopathy.

Lungs/Pleura: High-resolution images demonstrate no significant
regions of ground-glass attenuation, septal thickening, subpleural
reticulation, parenchymal banding, traction bronchiectasis or frank
honeycombing to indicate interstitial lung disease. Inspiratory and
expiratory imaging is unremarkable. No acute consolidative airspace
disease. No pleural effusions. No suspicious appearing pulmonary
nodules or masses are noted.

Upper Abdomen: Unremarkable.

Musculoskeletal: There are no aggressive appearing lytic or blastic
lesions noted in the visualized portions of the skeleton.
IMPRESSION: 1. No findings to suggest interstitial lung disease.
2. No acute findings are noted to account for the patient's
symptoms.
3. Mild atherosclerosis.

## 2022-01-13 ENCOUNTER — Other Ambulatory Visit (HOSPITAL_COMMUNITY): Payer: Self-pay | Admitting: Physician Assistant

## 2022-01-13 ENCOUNTER — Other Ambulatory Visit: Payer: Self-pay | Admitting: Physician Assistant

## 2022-01-13 DIAGNOSIS — R911 Solitary pulmonary nodule: Secondary | ICD-10-CM

## 2022-01-23 ENCOUNTER — Ambulatory Visit (HOSPITAL_COMMUNITY): Payer: Medicare Other

## 2022-03-05 ENCOUNTER — Ambulatory Visit (HOSPITAL_COMMUNITY): Payer: Medicare Other

## 2022-03-26 ENCOUNTER — Ambulatory Visit (HOSPITAL_COMMUNITY)
Admission: RE | Admit: 2022-03-26 | Discharge: 2022-03-26 | Disposition: A | Discharge status: Home / Self Care | Payer: Medicare Other | Source: Ambulatory Visit | Attending: Physician Assistant | Admitting: Physician Assistant

## 2022-03-26 DIAGNOSIS — R911 Solitary pulmonary nodule: Secondary | ICD-10-CM | POA: Insufficient documentation

## 2022-05-10 ENCOUNTER — Emergency Department (HOSPITAL_COMMUNITY)
Admission: EM | Admit: 2022-05-10 | Discharge: 2022-05-10 | Disposition: A | Discharge status: Home / Self Care | Payer: Medicare Other | Attending: Student | Admitting: Student

## 2022-05-10 ENCOUNTER — Emergency Department (HOSPITAL_COMMUNITY): Payer: Medicare Other

## 2022-05-10 ENCOUNTER — Other Ambulatory Visit: Payer: Self-pay

## 2022-05-10 DIAGNOSIS — Z8616 Personal history of COVID-19: Secondary | ICD-10-CM | POA: Diagnosis not present

## 2022-05-10 DIAGNOSIS — Z7984 Long term (current) use of oral hypoglycemic drugs: Secondary | ICD-10-CM | POA: Insufficient documentation

## 2022-05-10 DIAGNOSIS — R531 Weakness: Secondary | ICD-10-CM | POA: Diagnosis present

## 2022-05-10 DIAGNOSIS — Z7982 Long term (current) use of aspirin: Secondary | ICD-10-CM | POA: Diagnosis not present

## 2022-05-10 DIAGNOSIS — U071 COVID-19: Secondary | ICD-10-CM | POA: Insufficient documentation

## 2022-05-10 DIAGNOSIS — E119 Type 2 diabetes mellitus without complications: Secondary | ICD-10-CM | POA: Diagnosis not present

## 2022-05-10 LAB — CBC WITH DIFFERENTIAL/PLATELET
Abs Immature Granulocytes: 0.02 10*3/uL (ref 0.00–0.07)
Basophils Absolute: 0 10*3/uL (ref 0.0–0.1)
Basophils Relative: 1 %
Eosinophils Absolute: 0.2 10*3/uL (ref 0.0–0.5)
Eosinophils Relative: 2 %
HCT: 39.6 % (ref 36.0–46.0)
Hemoglobin: 12.8 g/dL (ref 12.0–15.0)
Immature Granulocytes: 0 %
Lymphocytes Relative: 22 %
Lymphs Abs: 1.9 10*3/uL (ref 0.7–4.0)
MCH: 29.9 pg (ref 26.0–34.0)
MCHC: 32.3 g/dL (ref 30.0–36.0)
MCV: 92.5 fL (ref 80.0–100.0)
Monocytes Absolute: 0.5 10*3/uL (ref 0.1–1.0)
Monocytes Relative: 6 %
Neutro Abs: 5.7 10*3/uL (ref 1.7–7.7)
Neutrophils Relative %: 69 %
Platelets: 207 10*3/uL (ref 150–400)
RBC: 4.28 MIL/uL (ref 3.87–5.11)
RDW: 13.6 % (ref 11.5–15.5)
WBC: 8.3 10*3/uL (ref 4.0–10.5)
nRBC: 0 % (ref 0.0–0.2)

## 2022-05-10 LAB — COMPREHENSIVE METABOLIC PANEL
ALT: 15 U/L (ref 0–44)
AST: 15 U/L (ref 15–41)
Albumin: 3.5 g/dL (ref 3.5–5.0)
Alkaline Phosphatase: 46 U/L (ref 38–126)
Anion gap: 6 (ref 5–15)
BUN: 10 mg/dL (ref 6–20)
CO2: 27 mmol/L (ref 22–32)
Calcium: 9.2 mg/dL (ref 8.9–10.3)
Chloride: 107 mmol/L (ref 98–111)
Creatinine, Ser: 0.8 mg/dL (ref 0.44–1.00)
GFR, Estimated: >60 mL/min (ref >60)
Glucose, Bld: 144 mg/dL — ABNORMAL HIGH (ref 70–99)
Potassium: 4.5 mmol/L (ref 3.5–5.1)
Sodium: 140 mmol/L (ref 135–145)
Total Bilirubin: 0.8 mg/dL (ref 0.3–1.2)
Total Protein: 6.7 g/dL (ref 6.5–8.1)

## 2022-05-10 LAB — GROUP A STREP BY PCR: Group A Strep by PCR: NOT DETECTED

## 2022-05-10 LAB — TROPONIN I (HIGH SENSITIVITY): Troponin I (High Sensitivity): <2 ng/L (ref <18)

## 2022-05-10 MED ORDER — CEPACOL 15-2.3 MG MT LOZG: 1.0000 | LOZENGE | Freq: Four times a day (QID) | OROMUCOSAL | 0 refills | Status: DC

## 2022-05-10 MED ORDER — KETOROLAC TROMETHAMINE 15 MG/ML IJ SOLN
15.0000 mg | Freq: Once | INTRAMUSCULAR | Status: AC
  Administered 2022-05-10: 15 mg via INTRAVENOUS

## 2022-05-10 MED ORDER — LACTATED RINGERS IV BOLUS
1000.0000 mL | Freq: Once | INTRAVENOUS | Status: AC
  Administered 2022-05-10: 1000 mL via INTRAVENOUS

## 2022-05-10 MED ORDER — NAPROXEN 375 MG PO TABS: 375.0000 mg | ORAL_TABLET | Freq: Two times a day (BID) | ORAL | 0 refills | Status: DC

## 2022-05-10 MED ORDER — GUAIFENESIN ER 600 MG PO TB12: 600.0000 mg | ORAL_TABLET | Freq: Two times a day (BID) | ORAL | 0 refills | Status: AC

## 2022-05-10 MED ORDER — GUAIFENESIN ER 600 MG PO TB12: 600.0000 mg | ORAL_TABLET | Freq: Two times a day (BID) | ORAL | 0 refills | Status: DC

## 2022-05-10 NOTE — Discharge Instructions (Addendum)
Your blood tests were normal today.  Most people have acute covid symptoms for 5-7 days, but can last up to 10 days in some patients.  Remember to drink lots of water.  Use over the counter cold-and-flu medications as needed for your symptoms.  If you are prescribed paxlovid or antiviral medications you should continue these.

## 2022-05-10 NOTE — ED Provider Notes (Signed)
51 yo w/ covid here pending labs, anticipate discharge if normal She is on paxlovid already   Physical Exam  BP (!) 115/94 (BP Location: Left Arm)   Pulse 85   Temp 98.3 F (36.8 C) (Oral)   Resp 18   LMP 10/11/2018 (Exact Date)   SpO2 96%   Physical Exam  Procedures  Procedures  ED Course / MDM    Medical Decision Making Amount and/or Complexity of Data Reviewed Labs: ordered. Radiology: ordered.  Risk OTC drugs. Prescription drug management.   Labs personally reviewed interpreted and are unremarkable. Okay for discharge       Terald Sleeper, MD 05/10/22 [phone]

## 2022-05-10 NOTE — ED Triage Notes (Signed)
Patient tested positive for Covid on 05/08/2022 at a doctors office and has had increasing headaches, coughing and sore throat.

## 2022-05-20 NOTE — ED Provider Notes (Signed)
[facility] EMERGENCY DEPARTMENT Provider Note  CSN: [id number] Arrival date & time: 05/10/22 7858  Chief Complaint(s) Weakness (Covid symptoms worsening, cough, headache, congestion)  HPI Jenny Combs is a 51 y.o. female with PMH bipolar 1, T2DM, fibromyalgia, arthritis who presents emergency department for evaluation of generalized fatigue and upper respiratory symptoms in the setting of a known COVID-19 infection.  Tested positive for COVID 19 on 05/08/2022 and is currently already on paxlovid.  Patient endorsing worsening headache, coughing and sore throat.  Denies chest pain, shortness of breath, abdominal pain, nausea, vomiting or other systemic symptoms.   Past Medical History Past Medical History:  Diagnosis Date   Arthritis    Back pain    Bipolar 1 disorder (HCC)    Diabetes mellitus without complication (HCC)    Fibromyalgia    PTSD (post-traumatic stress disorder)    There are no problems to display for this patient.  Home Medication(s) Prior to Admission medications   Medication Sig Start Date End Date Taking? Authorizing Provider  Aspirin-Acetaminophen-Caffeine (GOODY HEADACHE PO) Take 1 each by mouth 3 (three) times daily as needed (headaches).    [provider]  Benzocaine-Menthol (CEPACOL) 15-2.3 MG LOZG Use as directed 1 lozenge in the mouth or throat every 6 (six) hours. 05/10/22   Javaris Wigington, MD  benzonatate (TESSALON) 100 MG capsule Take 1 capsule (100 mg total) by mouth every 8 (eight) hours. 01/02/20   Wurst, [place], PA-C  emtricitabine-tenofovir (TRUVADA) 200-300 MG tablet Take 1 tablet by mouth daily. 04/02/17   Kuppelweiser, Cassie L, RPH-CPP  guaiFENesin (MUCINEX) 600 MG 12 hr tablet Take 1 tablet (600 mg total) by mouth 2 (two) times daily for 14 days. 05/10/22 05/24/22  Tamari Busic, MD  HYDROcodone-acetaminophen (NORCO/VICODIN) 5-325 MG per tablet Take 1 tablet by mouth every 6 (six) hours as needed. 01/30/15   Roxy Horseman, PA-C   magic mouthwash w/lidocaine SOLN Take 5 mLs by mouth 3 (three) times daily as needed for mouth pain. 01/02/20   Wurst, [place], PA-C  metFORMIN (GLUCOPHAGE) 500 MG tablet Take 500 mg by mouth 2 (two) times daily with a meal.    [provider]  naproxen (NAPROSYN) 375 MG tablet Take 1 tablet (375 mg total) by mouth 2 (two) times daily. 05/10/22   Roylene Heaton, MD  ondansetron (ZOFRAN) 4 MG tablet Take 1 tablet (4 mg total) by mouth every 6 (six) hours as needed for nausea. 09/19/18   Loren Racer, MD  oxyCODONE-acetaminophen (PERCOCET) 10-325 MG tablet Take 1 tablet by mouth every 4 (four) hours as needed for pain.    [provider]  PARoxetine (PAXIL) 10 MG tablet Take 10 mg by mouth daily.    [provider]  traMADol (ULTRAM) 50 MG tablet Take 50 mg by mouth every 6 (six) hours as needed for moderate pain.    [provider]  Past Surgical History Past Surgical History:  Procedure Laterality Date   CESAREAN SECTION     COMPARTMENT SYNDROME MEASUREMENT     Family History Family History  Problem Relation Age of Onset   Diabetes Mother    Cancer Mother    Hypertension Mother    Diabetes Father    Cancer Father    Hypertension Father     Social History Social History   Tobacco Use   Smoking status: Never   Smokeless tobacco: Never  Vaping Use   Vaping Use: Never used  Substance Use Topics   Alcohol use: No   Drug use: Never   Allergies Atorvastatin, Ibuprofen, and Statins  Review of Systems Review of Systems  HENT:  Positive for rhinorrhea and sore throat.   Respiratory:  Positive for cough.   Neurological:  Positive for headaches.   Physical Exam Vital Signs  I have reviewed the triage vital signs BP 111/79   Pulse 87   Temp 98.4 F (36.9 C) (Oral)   Resp 16   LMP 10/11/2018 (Exact Date)    SpO2 96%   Physical Exam Vitals and nursing note reviewed.  Constitutional:      General: She is not in acute distress.    Appearance: She is well-developed.  HENT:     Head: Normocephalic and atraumatic.     Comments: Oropharyngeal erythema Eyes:     Conjunctiva/sclera: Conjunctivae normal.  Cardiovascular:     Rate and Rhythm: Normal rate and regular rhythm.     Heart sounds: No murmur heard. Pulmonary:     Effort: Pulmonary effort is normal. No respiratory distress.     Breath sounds: Normal breath sounds.  Abdominal:     Palpations: Abdomen is soft.     Tenderness: There is no abdominal tenderness.  Musculoskeletal:        General: No swelling.     Cervical back: Neck supple.  Skin:    General: Skin is warm and dry.     Capillary Refill: Capillary refill takes less than 2 seconds.  Neurological:     Mental Status: She is alert.  Psychiatric:        Mood and Affect: Mood normal.     ED Results and Treatments Labs (all labs ordered are listed, but only abnormal results are displayed) Labs Reviewed  COMPREHENSIVE METABOLIC PANEL - Abnormal; Notable for the following components:      Result Value   Glucose, Bld 144 (*)    All other components within normal limits  GROUP A STREP BY PCR  CBC WITH DIFFERENTIAL/PLATELET  TROPONIN I (HIGH SENSITIVITY)                                                                                                                          Radiology No results found.  Pertinent labs & imaging results that were available during my care of the patient were reviewed by me and considered in my medical decision making (see  MDM for details).  Medications Ordered in ED Medications  lactated ringers bolus 1,000 mL (0 mLs Intravenous Stopped 05/10/22 0723)  ketorolac (TORADOL) 15 MG/ML injection 15 mg (15 mg Intravenous Given 05/10/22 0525)                                                                                                                                      Procedures Procedures  (including critical care time)  Medical Decision Making / ED Course   This patient presents to the ED for concern of cough, sore throat, HA, this involves an extensive number of treatment options, and is a complaint that carries with it a high risk of complications and morbidity.  The differential diagnosis includes COVID-19, paxlovid side effect, electrolyte abnormality, pneumonia  MDM: The emergency room for evaluation of multiple complaints described above.  Physical exam with oropharyngeal erythema but is otherwise unremarkable.  No significant wheezing heard on lung exam.  Laboratory evaluation largely unremarkable.  Chest x-ray unremarkable.  Patient given fluid resuscitation and Toradol and on reevaluation symptoms have improved.  Patient does not meet inpatient criteria for admission and is safe for discharge with outpatient follow-up.  Patient then discharged   Additional history obtained:  -External records from outside source obtained and reviewed including: Chart review including previous notes, labs, imaging, consultation notes   Lab Tests: -I ordered, reviewed, and interpreted labs.   The pertinent results include:   Labs Reviewed  COMPREHENSIVE METABOLIC PANEL - Abnormal; Notable for the following components:      Result Value   Glucose, Bld 144 (*)    All other components within normal limits  GROUP A STREP BY PCR  CBC WITH DIFFERENTIAL/PLATELET  TROPONIN I (HIGH SENSITIVITY)      Imaging Studies ordered: I ordered imaging studies including chest x-ray I independently visualized and interpreted imaging. I agree with the radiologist interpretation   Medicines ordered and prescription drug management: Meds ordered this encounter  Medications   lactated ringers bolus 1,000 mL   ketorolac (TORADOL) 15 MG/ML injection 15 mg   DISCONTD: Benzocaine-Menthol (CEPACOL) 15-2.3 MG LOZG    Sig: Use as directed 1  lozenge in the mouth or throat every 6 (six) hours.    Dispense:  16 lozenge    Refill:  0   DISCONTD: guaiFENesin (MUCINEX) 600 MG 12 hr tablet    Sig: Take 1 tablet (600 mg total) by mouth 2 (two) times daily for 14 days.    Dispense:  28 tablet    Refill:  0   DISCONTD: naproxen (NAPROSYN) 375 MG tablet    Sig: Take 1 tablet (375 mg total) by mouth 2 (two) times daily.    Dispense:  20 tablet    Refill:  0   Benzocaine-Menthol (CEPACOL) 15-2.3 MG LOZG    Sig: Use as directed 1 lozenge in the mouth or throat every 6 (six) hours.  Dispense:  16 lozenge    Refill:  0   guaiFENesin (MUCINEX) 600 MG 12 hr tablet    Sig: Take 1 tablet (600 mg total) by mouth 2 (two) times daily for 14 days.    Dispense:  28 tablet    Refill:  0   naproxen (NAPROSYN) 375 MG tablet    Sig: Take 1 tablet (375 mg total) by mouth 2 (two) times daily.    Dispense:  20 tablet    Refill:  0    -I have reviewed the patients home medicines and have made adjustments as needed  Critical interventions none    Cardiac Monitoring: The patient was maintained on a cardiac monitor.  I personally viewed and interpreted the cardiac monitored which showed an underlying rhythm of: NSR  Social Determinants of Health:  Factors impacting patients care include: none   Reevaluation: After the interventions noted above, I reevaluated the patient and found that they have :improved  Co morbidities that complicate the patient evaluation  Past Medical History:  Diagnosis Date   Arthritis    Back pain    Bipolar 1 disorder (HCC)    Diabetes mellitus without complication (HCC)    Fibromyalgia    PTSD (post-traumatic stress disorder)       Dispostion: I considered admission for this patient, but she does not meet inpatient criteria for admission and is safe for discharge and outpatient follow-up     Final Clinical Impression(s) / ED Diagnoses Final diagnoses:  COVID     @PCDICTATION @    Oseas Detty,  , MD 05/20/22 2320

## 2022-08-04 ENCOUNTER — Telehealth: Payer: Self-pay | Admitting: Nurse Practitioner

## 2022-08-04 ENCOUNTER — Telehealth: Payer: Self-pay | Admitting: "Endocrinology

## 2022-08-04 NOTE — Telephone Encounter (Signed)
Received referral on pt under Media for Thyroid Nodule. We did not receive a ultrasound or labs, I left a VM at the referring provider's office to please send this. Once we obtain this, we can schedule the patient.

## 2022-08-04 NOTE — Telephone Encounter (Signed)
Received the rest of the information under media. Called pt left VM to sch

## 2022-08-04 NOTE — Telephone Encounter (Signed)
I just reviewed it.  Does not look urgent, can wait til 3/18

## 2022-08-04 NOTE — Telephone Encounter (Signed)
Pt stated sonogram showed 2 nodules that were possibly an issue and needed a biopsy.  Made appointment for 09/15/22, but she wanted doctor to check over results to see if she needed to be seen earlier.

## 2022-08-06 NOTE — Telephone Encounter (Signed)
Called pt and let her know appt on 3/18 was fine per Rayetta Pigg, NP.

## 2022-09-03 ENCOUNTER — Encounter: Payer: Self-pay | Admitting: Nurse Practitioner

## 2022-09-15 ENCOUNTER — Ambulatory Visit: Payer: Medicare Other | Admitting: Nurse Practitioner

## 2022-09-15 ENCOUNTER — Encounter: Payer: Self-pay | Admitting: Nurse Practitioner

## 2022-09-15 VITALS — BP 124/79 | HR 89 | Ht 71.0 in | Wt 222.4 lb

## 2022-09-15 DIAGNOSIS — E042 Nontoxic multinodular goiter: Secondary | ICD-10-CM

## 2022-09-15 NOTE — Progress Notes (Signed)
Endocrinology Consult Note 09/15/22    ---------------------------------------------------------------------------------------------------------------------- Subjective    Past Medical History:  Diagnosis Date   Arthritis    Back pain    Bipolar 1 disorder ([place])    Diabetes mellitus without complication (HCC)    Fibromyalgia    PTSD (post-traumatic stress disorder)     Past Surgical History:  Procedure Laterality Date   CESAREAN SECTION     COMPARTMENT SYNDROME MEASUREMENT      Social History   Socioeconomic History   Marital status: Married    Spouse name: Not on file   Number of children: Not on file   Years of education: Not on file   Highest education level: Not on file  Occupational History   Not on file  Tobacco Use   Smoking status: Never   Smokeless tobacco: Never  Vaping Use   Vaping Use: Never used  Substance and Sexual Activity   Alcohol use: No   Drug use: Never   Sexual activity: Yes    Birth control/protection: None  Other Topics Concern   Not on file  Social History Narrative   Not on file   Social Determinants of Health   Financial Resource Strain: Not on file  Food Insecurity: Not on file  Transportation Needs: Not on file  Physical Activity: Not on file  Stress: Not on file  Social Connections: Not on file  Intimate Partner Violence: Not on file    Current Outpatient Medications on File Prior to Visit  Medication Sig Dispense Refill   Aspirin-Acetaminophen-Caffeine (GOODY HEADACHE PO) Take 1 each by mouth 3 (three) times daily as needed (headaches).     HYDROcodone-acetaminophen (NORCO/VICODIN) 5-325 MG per tablet Take 1 tablet by mouth every 6 (six) hours as needed. 10 tablet 0   metFORMIN (GLUCOPHAGE) 500 MG tablet Take 500 mg by mouth 2 (two) times daily with a meal.     ondansetron (ZOFRAN) 4 MG tablet Take 1 tablet (4 mg total) by mouth every 6 (six) hours as needed for nausea. 12 tablet 0   oxyCODONE-acetaminophen (PERCOCET)  10-325 MG tablet Take 1 tablet by mouth every 4 (four) hours as needed for pain.     Semaglutide, 2 MG/DOSE, (OZEMPIC, 2 MG/DOSE,) 8 MG/3ML SOPN Inject 2 mg into the skin once a week. Patient states that she is taking 125 weekly     traMADol (ULTRAM) 50 MG tablet Take 50 mg by mouth every 6 (six) hours as needed for moderate pain.     Benzocaine-Menthol (CEPACOL) 15-2.3 MG LOZG Use as directed 1 lozenge in the mouth or throat every 6 (six) hours. (Patient not taking: Reported on 09/15/2022) 16 lozenge 0   benzonatate (TESSALON) 100 MG capsule Take 1 capsule (100 mg total) by mouth every 8 (eight) hours. (Patient not taking: Reported on 09/15/2022) 21 capsule 0   emtricitabine-tenofovir (TRUVADA) 200-300 MG tablet Take 1 tablet by mouth daily. (Patient not taking: Reported on 09/15/2022) 30 tablet 2   magic mouthwash w/lidocaine SOLN Take 5 mLs by mouth 3 (three) times daily as needed for mouth pain. (Patient not taking: Reported on 09/15/2022) 60 mL 0   naproxen (NAPROSYN) 375 MG tablet Take 1 tablet (375 mg total) by mouth 2 (two) times daily. (Patient not taking: Reported on 09/15/2022) 20 tablet 0   PARoxetine (PAXIL) 10 MG tablet Take 10 mg by mouth daily. (Patient not taking: Reported on 09/15/2022)     No current facility-administered medications on file prior to visit.  HPI   Jenny Combs is a 52 y.o.-year-old female, referred by her PCP, for evaluation for multinodular goiter.  She was first diagnosed with thyroid nodules incidentally after CT scan in 2012 at [facility] after fall on her porch.  She had ultrasound which showed several small nodules.  She notes her life got crazy and her thyroid condition got pushed to the back burner until recently, she was inquiring about a follow up for those nodules with her PCP.  Thyroid U/S: THYROID ULTRASOUND 07/30/22  INDICATION: Thyroid nodule   COMPARISON: None   TECHNIQUE: Gray-scale and color Doppler images of the thyroid gland were obtained.    FINDINGS:   ISTHMUS:  - Size: 0.3 cm.   RIGHT LOBE:  - Size: 4.2 x 1.6 x 1.2 cm.  - Echogenicity: Normal.   LEFT LOBE:  - Size: 4.4 x 1.4 x 1.5 cm.  - Echogenicity: Normal.   NODULES:   - Nodule 1:  -- Size: 0.7 x 0.6 x 0.6 cm (long x AP x trans).  -- Location: Right upper pole laterally.   -- Composition: solid or almost completely solid (2)  -- Echogenicity: hypoechoic (2)  -- Shape: wider-than-tall (0)  -- Margins: smooth (0)  -- Echogenic foci: none (0)   -- ACR TI-RADS total points and risk category: 4 Points - TR4.   - Nodule 2:  -- Size: 1.5 x 0.6 x 0.9 cm (long x AP x trans).  -- Location: Right midpole .   -- Composition: solid or almost completely solid (2)  -- Echogenicity: hypoechoic (2)  -- Shape: wider-than-tall (0)  -- Margins: smooth (0)  -- Echogenic foci: none (0)   -- ACR TI-RADS total points and risk category: 4 Points - TR4.   - Nodule 3:  -- Size: 1.1 x 1 x 1 cm (long x AP x trans).  -- Location: Left mid pole medially .   -- Composition: solid or almost completely solid (2)  -- Echogenicity: hypoechoic (2)  -- Shape: wider-than-tall (0)  -- Margins: lobulated/irregular (2)  -- Echogenic foci: punctate echogenic foci (3)   -- ACR TI-RADS total points and risk category: 9 Points - TR5.   - Nodule 4:  -- Size: 1.1 x 0.7 x 0.8 cm (long x AP x trans).  -- Location: Left lateral mid gland .   -- Composition: solid or almost completely solid (2)  -- Echogenicity: hypoechoic (2)  -- Shape: wider-than-tall (0)  -- Margins: lobulated/irregular (2)  -- Echogenic foci: macrocalcifications (1)   -- ACR TI-RADS total points and risk category: 7 Points - TR5.   - Nodule 5:  -- Size: 0.9 x 0.6 x 0.7 cm (long x AP x trans).  -- Location: Left inferior lateral .   -- Composition: solid or almost completely solid (2)  -- Echogenicity: hypoechoic (2)  -- Shape: wider-than-tall (0)  -- Margins: smooth (0)  -- Echogenic foci:  macrocalcifications (1)   -- ACR TI-RADS total points and risk category: 5 Points - TR4.   MISCELLANEOUS:  N/A.    IMPRESSION:  1. Nodule 1: ACR TI-RADS 4: Recommendation: No further follow-up is recommended.  2.  Nodule 2: ACR TI-RADS 4: Recommendation: Follow-up ultrasound in 1 year.  3.  Nodule 3: ACR TI-RADS 5: Recommendation: Ultrasound-guided fine needle aspiration.  4.  Nodule 4: ACR TI-RADS 5: Recommendation: Ultrasound-guided fine needle aspiration.  5.  Nodule 5: ACR TI-RADS 4: Recommendation: Follow-up ultrasound in one year.    I reviewed pt's  thyroid tests: No results found for: "TSH", "FREET4"   Pt denies any real symptoms of thyroid dysfunction other than weight gain.  She denies: - feeling nodules in neck - hoarseness - dysphagia - choking - SOB with lying down  She does have family history of thyroid disease in her mom and her daughter (thinks underactive). No FH of thyroid cancer. No h/o radiation tx to head or neck.  No seaweed or kelp. No recent contrast studies. No steroid use. No herbal supplements. No Biotin supplements or Hair, Skin and Nails vitamins.  Pt also has a history of bipolar disorder, anxiety, HTN, low vitamin D, HLD, neuropathy, type 2 Diabetes, chronic pain.  Review of systems  Constitutional: + Minimally fluctuating body weight,  current Body mass index is 31.02 kg/m. , no fatigue, no subjective hyperthermia, no subjective hypothermia Eyes: no blurry vision, no xerophthalmia ENT: no sore throat, no nodules palpated in throat, no dysphagia/odynophagia, no hoarseness Cardiovascular: no chest pain, no shortness of breath, no palpitations, no leg swelling Respiratory: no cough, no shortness of breath Gastrointestinal: no nausea/vomiting/diarrhea Musculoskeletal: no muscle/joint aches Skin: no rashes, no hyperemia Neurological: no tremors, no numbness, no tingling, no dizziness Psychiatric: no depression, no  anxiety  ---------------------------------------------------------------------------------------------------------------------- Objective    BP 124/79 (BP Location: Left Arm, Patient Position: Sitting, Cuff Size: Large)   Pulse 89   Ht 5\' 11"  (1.803 m)   Wt 222 lb 6.4 oz (100.9 kg)   LMP 10/11/2018 (Exact Date)   BMI 31.02 kg/m    BP Readings from Last 3 Encounters:  09/15/22 124/79  05/10/22 111/79  06/09/21 (!) 118/57    Wt Readings from Last 3 Encounters:  09/15/22 222 lb 6.4 oz (100.9 kg)  08/21/20 231 lb (104.8 kg)  10/28/18 195 lb (88.5 kg)     Physical Exam- Limited  Constitutional:  Body mass index is 31.02 kg/m. , not in acute distress, mildly anxious state of mind Eyes:  EOMI, no exophthalmos Neck: Supple Thyroid: No gross goiter, no palpable nodularity Cardiovascular: RRR, no murmurs, rubs, or gallops, no edema Respiratory: Adequate breathing efforts, no crackles, rales, rhonchi, or wheezing Musculoskeletal: no gross deformities, strength intact in all four extremities, no gross restriction of joint movements Skin:  no rashes, no hyperemia Neurological: no tremor with outstretched hands      ----------------------------------------------------------------------------------------------------------------------  ASSESSMENT / PLAN:  1. Thyroid Nodule  - I reviewed the images of her thyroid ultrasound along with the patient. I pointed out that the dominant nodules are small, not typically being risky for cancer.  2 of the nodules have irregular margins, lobular shaped and have macrocalcifications, this being a risk factor for cancer.   Pt does not have a thyroid cancer family history or a personal history of RxTx to head/neck. All these would favor benignity.  - the only way that we can tell exactly if it is cancer or not is by doing a thyroid biopsy (FNA). I explained what the test entails. - We discussed about other options, to wait for another 6 months to  a year and see if the nodule grows, and only intervene at that time.   - patient decided to have the FNA done now >> I ordered this.   - I explained that this is not cancer, we can continue to follow her on a yearly basis, and check another ultrasound in another year or 2. - she should let me know if she develops neck compression symptoms, in that case, we might need  to do either lobectomy or thyroidectomy  - I did explain that, while thyroid surgery is not a complicated one, it still can have side effects and also she might have a risk of ~25% of becoming hypothyroid after hemithyroidectomy.   I also ordered baseline thyroid blood testing, including antibody testing to assess for autoimmune dysfunction.     FOLLOW UP PLAN: Return in 4 weeks (on 10/13/2022) for Thyroid follow up, Previsit labs, FNA.    I spent 45 minutes in the care of the patient today including review of labs from Thyroid Function, CMP, and other relevant labs ; imaging/biopsy records (current and previous including abstractions from other facilities); face-to-face time discussing  her lab results and symptoms, medications doses, her options of short and long term treatment based on the latest standards of care / guidelines;   and documenting the encounter.  Jenny Combs  participated in the discussions, expressed understanding, and voiced agreement with the above plans.  All questions were answered to her satisfaction. she is encouraged to contact clinic should she have any questions or concerns prior to her return visit.    Rayetta Pigg, [facility] [address] [place], [place] [address] Phone: [phone] Fax: [phone]

## 2022-09-15 NOTE — Patient Instructions (Signed)
Thyroid Needle Biopsy  The thyroid is a gland in the front of the neck. It makes hormones that help control how the body works. This includes how the body grows and develops, stays at a healthy temperature, and uses food for energy (metabolism). Thyroid needle biopsy is a procedure to remove small samples of tissue or fluid from the thyroid. The samples are then looked at under a microscope. This may be done to check for cancer, infection, or other problems with the thyroid. Tell a health care provider about: Any allergies you have. All medicines you are taking, including vitamins, herbs, eye drops, creams, and over-the-counter medicines. Any problems you or family members have had with anesthesia. Any bleeding problems you have. Any surgeries you have had. Any medical conditions you have. Whether you are pregnant or may be pregnant. What are the risks? Your health care provider will talk with you about risks. These may include: Infection. Bleeding. Allergic reactions to medicines. Damage to nerves or blood vessels in the neck. What happens during the procedure? You will be asked to lie on your back with your head tipped backward. This helps you extend your neck. You may be asked to try not to cough, talk, swallow, or make sounds during some parts of the procedure. The skin of your neck will be washed with a soap that kills germs. Medicine may be put into the skin over your thyroid to numb the area. An ultrasound may be done to help guide the needle to where it needs to go. A thin needle (fine needle) will be inserted through the skin and into your thyroid. It will be used to remove tissue or fluid samples. The needle will be removed. The samples will be sent to a lab. Pressure may be applied to your neck to reduce swelling and stop bleeding. The procedure may vary among providers and hospitals. What happens after the procedure? It is up to you to get the results of your procedure. Ask  your provider, or the department that is doing the procedure, when your results will be ready. This information is not intended to replace advice given to you by your health care provider. Make sure you discuss any questions you have with your health care provider. Document Revised: 01/16/2022 Document Reviewed: 01/16/2022 Elsevier Patient Education  2023 Elsevier Inc.  

## 2022-09-16 ENCOUNTER — Telehealth: Payer: Self-pay | Admitting: Nurse Practitioner

## 2022-09-16 NOTE — Telephone Encounter (Signed)
Can you please call patient and let her know that all labs that we have gotten back so far are normal?  Still waiting on one to result to come through so that is why I havent yet reached out to her.

## 2022-09-16 NOTE — Telephone Encounter (Signed)
Pt is asking about her lab results. She seen they are coming through [facility]. She is asking for a call back

## 2022-09-17 ENCOUNTER — Telehealth: Payer: Self-pay | Admitting: *Deleted

## 2022-09-17 ENCOUNTER — Ambulatory Visit (HOSPITAL_COMMUNITY)
Admission: RE | Admit: 2022-09-17 | Discharge: 2022-09-17 | Disposition: A | Discharge status: Home / Self Care | Payer: Medicare Other | Source: Ambulatory Visit | Attending: Nurse Practitioner | Admitting: Nurse Practitioner

## 2022-09-17 ENCOUNTER — Encounter: Payer: Self-pay | Admitting: Nurse Practitioner

## 2022-09-17 ENCOUNTER — Other Ambulatory Visit (HOSPITAL_COMMUNITY): Payer: Self-pay | Admitting: Nurse Practitioner

## 2022-09-17 ENCOUNTER — Encounter (HOSPITAL_COMMUNITY): Payer: Self-pay

## 2022-09-17 DIAGNOSIS — E041 Nontoxic single thyroid nodule: Secondary | ICD-10-CM | POA: Insufficient documentation

## 2022-09-17 DIAGNOSIS — E042 Nontoxic multinodular goiter: Secondary | ICD-10-CM

## 2022-09-17 LAB — T4, FREE: Free T4: 1.04 ng/dL (ref 0.82–1.77)

## 2022-09-17 LAB — T3, FREE: T3, Free: 2.8 pg/mL (ref 2.0–4.4)

## 2022-09-17 LAB — TSH: TSH: 0.834 u[IU]/mL (ref 0.450–4.500)

## 2022-09-17 LAB — THYROGLOBULIN ANTIBODY: Thyroglobulin Antibody: <1 IU/mL (ref 0.0–0.9)

## 2022-09-17 LAB — THYROID PEROXIDASE ANTIBODY: Thyroperoxidase Ab SerPl-aCnc: 11 IU/mL (ref 0–34)

## 2022-09-17 MED ORDER — LIDOCAINE HCL (PF) 2 % IJ SOLN
INTRAMUSCULAR | Status: AC
  Administered 2022-09-17: 10 mL
  Filled 2022-09-17: qty 10

## 2022-09-17 NOTE — Procedures (Signed)
Successful US guided FNA of left mid medial thyroid nodule No complications. See PACS for full report. Successful US guided FNA of left mid lateral thyroid nodule No complications. See PACS for full report.       Narda Rutherford, AGNP-BC 09/17/2022, 10:50 AM

## 2022-09-17 NOTE — Telephone Encounter (Signed)
Noted  

## 2022-09-17 NOTE — Telephone Encounter (Signed)
-----   Message from Brita Romp, NP sent at 09/17/2022  7:01 AM EDT ----- FYI: just sent patient message detailing her thyroid labs

## 2022-09-17 NOTE — Telephone Encounter (Signed)
Patient was called and given results.  She was at the hospital waiting to have biopsy done at 10 am. It looks looks all the labs that you ordered are listed as final result.

## 2022-09-17 NOTE — Telephone Encounter (Signed)
Per Loree Fee , she has sent the patient a message covering the results of the lab that she was waiting on.

## 2022-09-17 NOTE — Progress Notes (Signed)
PT tolerated thyroid biopsies procedure well today. Labs and afirma obtained and sent for pathology. PT ambulatory at discharge with no acute distress noted and verbalized understanding of discharge instructions. 2 thyroid biopsies on the left thyroid sent to the lab for processing at 1053 by Kristen.

## 2022-09-19 LAB — CYTOLOGY - NON PAP

## 2022-09-22 ENCOUNTER — Telehealth: Payer: Self-pay | Admitting: *Deleted

## 2022-09-22 ENCOUNTER — Encounter: Payer: Self-pay | Admitting: Nurse Practitioner

## 2022-09-22 NOTE — Telephone Encounter (Signed)
-----   Message from Brita Romp, NP sent at 09/22/2022  7:25 AM EDT ----- Juluis Rainier I sent patient message going over thyroid biopsy preliminary results.

## 2022-09-22 NOTE — Telephone Encounter (Signed)
Jenny Combs has sent a message to the patient about her results.

## 2022-09-26 ENCOUNTER — Emergency Department (HOSPITAL_COMMUNITY)
Admission: EM | Admit: 2022-09-26 | Discharge: 2022-09-26 | Disposition: A | Discharge status: Home / Self Care | Payer: Medicare Other | Attending: Emergency Medicine | Admitting: Emergency Medicine

## 2022-09-26 ENCOUNTER — Other Ambulatory Visit: Payer: Self-pay

## 2022-09-26 DIAGNOSIS — Z7984 Long term (current) use of oral hypoglycemic drugs: Secondary | ICD-10-CM | POA: Insufficient documentation

## 2022-09-26 DIAGNOSIS — L509 Urticaria, unspecified: Secondary | ICD-10-CM | POA: Diagnosis not present

## 2022-09-26 DIAGNOSIS — E119 Type 2 diabetes mellitus without complications: Secondary | ICD-10-CM | POA: Insufficient documentation

## 2022-09-26 DIAGNOSIS — T7840XA Allergy, unspecified, initial encounter: Secondary | ICD-10-CM | POA: Diagnosis present

## 2022-09-26 DIAGNOSIS — R590 Localized enlarged lymph nodes: Secondary | ICD-10-CM | POA: Diagnosis not present

## 2022-09-26 DIAGNOSIS — R0602 Shortness of breath: Secondary | ICD-10-CM | POA: Diagnosis not present

## 2022-09-26 LAB — POC URINE PREG, ED: Preg Test, Ur: NEGATIVE

## 2022-09-26 LAB — GROUP A STREP BY PCR: Group A Strep by PCR: NOT DETECTED

## 2022-09-26 MED ORDER — EPINEPHRINE 0.3 MG/0.3ML IJ SOAJ: 0.3000 mg | INTRAMUSCULAR | 0 refills | Status: AC | PRN

## 2022-09-26 MED ORDER — DEXAMETHASONE SODIUM PHOSPHATE 10 MG/ML IJ SOLN
10.0000 mg | Freq: Once | INTRAMUSCULAR | Status: AC
  Administered 2022-09-26: 10 mg via INTRAVENOUS
  Filled 2022-09-26: qty 1

## 2022-09-26 MED ORDER — DIPHENHYDRAMINE HCL 50 MG/ML IJ SOLN
25.0000 mg | Freq: Once | INTRAMUSCULAR | Status: AC
  Administered 2022-09-26: 25 mg via INTRAVENOUS
  Filled 2022-09-26: qty 1

## 2022-09-26 MED ORDER — SODIUM CHLORIDE 0.9 % IV BOLUS
1000.0000 mL | Freq: Once | INTRAVENOUS | Status: AC
  Administered 2022-09-26: 1000 mL via INTRAVENOUS

## 2022-09-26 MED ORDER — FAMOTIDINE IN NACL 20-0.9 MG/50ML-% IV SOLN
20.0000 mg | Freq: Once | INTRAVENOUS | Status: AC
  Administered 2022-09-26: 20 mg via INTRAVENOUS
  Filled 2022-09-26: qty 50

## 2022-09-26 NOTE — Discharge Instructions (Signed)
You are seen today in the urgency department due to allergic reaction.  workup was reassuring, I sent a refill of your EpiPen to the pharmacy to pick up and use in case of life-threatening emergency.  Call your primary, schedule follow-up in the next 2 days for repeat allergy testing.  Return to the ED for new or concerning symptoms.

## 2022-09-26 NOTE — ED Provider Notes (Signed)
[facility] Provider Note   CSN: [id number] Arrival date & time: 09/26/22  1651     History  Chief Complaint  Patient presents with   Allergic Reaction    Jenny Combs is a 51 y.o. female.   Allergic Reaction    Patient with medical history of bipolar 1, diabetes, fibromyalgia, PTSD presents to the emergency department due to allergic reaction.  About 40 minutes prior to arrival patient broke out in hives to her upper extremities bilaterally and upper thighs bilaterally.  She felt short of breath, some tightness in her chest.  No nausea or vomiting, her throat is sore but no difficulty swallowing does not tickle in the back of the throat.  She also has a husband and 3 children at home who recently tested positive for strep so I think it is more likely that.  He does have a history of previous allergic reactions requiring EpiPen, it has been a long time and is not currently using an EpiPen.  States the only thing new was new car, she was driving the car prior to this.  There is no outside environmental stimuli, she changed shampoo few weeks ago as well.  Otherwise no changes in medications or products.  Home Medicatio, got ns Prior to Admission medications   Medication Sig Start Date End Date Taking? Authorizing Provider  Aspirin-Acetaminophen-Caffeine (GOODY HEADACHE PO) Take 1 each by mouth 3 (three) times daily as needed (headaches).    [provider]  Benzocaine-Menthol (CEPACOL) 15-2.3 MG LOZG Use as directed 1 lozenge in the mouth or throat every 6 (six) hours. Patient not taking: Reported on 09/15/2022 05/10/22   Kommor, Debe Coder, MD  benzonatate (TESSALON) 100 MG capsule Take 1 capsule (100 mg total) by mouth every 8 (eight) hours. Patient not taking: Reported on 09/15/2022 01/02/20   Wurst, [place], PA-C  emtricitabine-tenofovir (TRUVADA) 200-300 MG tablet Take 1 tablet by mouth daily. Patient not taking: Reported on  09/15/2022 04/02/17   Kuppelweiser, Cassie L, RPH-CPP  HYDROcodone-acetaminophen (NORCO/VICODIN) 5-325 MG per tablet Take 1 tablet by mouth every 6 (six) hours as needed. 01/30/15   Montine Circle, PA-C  magic mouthwash w/lidocaine SOLN Take 5 mLs by mouth 3 (three) times daily as needed for mouth pain. Patient not taking: Reported on 09/15/2022 01/02/20   Wurst, [place], PA-C  metFORMIN (GLUCOPHAGE) 500 MG tablet Take 500 mg by mouth 2 (two) times daily with a meal.    [provider]  naproxen (NAPROSYN) 375 MG tablet Take 1 tablet (375 mg total) by mouth 2 (two) times daily. Patient not taking: Reported on 09/15/2022 05/10/22   Kommor, Debe Coder, MD  ondansetron (ZOFRAN) 4 MG tablet Take 1 tablet (4 mg total) by mouth every 6 (six) hours as needed for nausea. 09/19/18   Julianne Rice, MD  oxyCODONE-acetaminophen (PERCOCET) 10-325 MG tablet Take 1 tablet by mouth every 4 (four) hours as needed for pain.    [provider]  PARoxetine (PAXIL) 10 MG tablet Take 10 mg by mouth daily. Patient not taking: Reported on 09/15/2022    [provider]  Semaglutide, 2 MG/DOSE, (OZEMPIC, 2 MG/DOSE,) 8 MG/3ML SOPN Inject 2 mg into the skin once a week. Patient states that she is taking 125 weekly    [provider]  traMADol (ULTRAM) 50 MG tablet Take 50 mg by mouth every 6 (six) hours as needed for moderate pain.    [provider]      Allergies  Atorvastatin, Ibuprofen, Molds & smuts, and Statins    Review of Systems   Review of Systems  Physical Exam Updated Vital Signs BP 122/63   Pulse 86   Temp 97.6 F (36.4 C) (Oral)   Resp 18   Ht 5\' 8"  (1.727 m)   Wt 86.2 kg   LMP 10/11/2018 (Exact Date)   SpO2 99%   BMI 28.89 kg/m  Physical Exam Vitals and nursing note reviewed. Exam conducted with a chaperone present.  Constitutional:      Appearance: Normal appearance.  HENT:     Head: Normocephalic and atraumatic.     Mouth/Throat:     Mouth: Mucous  membranes are moist.     Comments: Speaking complete sentences, no uvular edema Eyes:     General: No scleral icterus.       Right eye: No discharge.        Left eye: No discharge.     Extraocular Movements: Extraocular movements intact.     Pupils: Pupils are equal, round, and reactive to light.  Cardiovascular:     Rate and Rhythm: Normal rate and regular rhythm.     Pulses: Normal pulses.     Heart sounds: Normal heart sounds. No murmur heard.    No friction rub. No gallop.  Pulmonary:     Effort: Pulmonary effort is normal. No respiratory distress.     Breath sounds: Normal breath sounds.  Abdominal:     General: Abdomen is flat. Bowel sounds are normal. There is no distension.     Palpations: Abdomen is soft.     Tenderness: There is no abdominal tenderness.  Lymphadenopathy:     Cervical: Cervical adenopathy present.  Skin:    General: Skin is warm and dry.     Coloration: Skin is not jaundiced.     Findings: Rash present.     Comments: Hives upper and lower extremities  Neurological:     Mental Status: She is alert. Mental status is at baseline.     Coordination: Coordination normal.     ED Results / Procedures / Treatments   Labs (all labs ordered are listed, but only abnormal results are displayed) Labs Reviewed  GROUP A STREP BY PCR  POC URINE PREG, ED    EKG None  Radiology No results found.  Procedures Procedures    Medications Ordered in ED Medications  famotidine (PEPCID) IVPB 20 mg premix (20 mg Intravenous New Bag/Given 09/26/22 1707)  sodium chloride 0.9 % bolus 1,000 mL (has no administration in time range)  diphenhydrAMINE (BENADRYL) injection 25 mg (25 mg Intravenous Given 09/26/22 1706)  dexamethasone (DECADRON) injection 10 mg (10 mg Intravenous Given 09/26/22 1706)    ED Course/ Medical Decision Making/ A&P                             Medical Decision Making Risk Prescription drug management.   Patient presents due to urticaria.   Concern for possible anaphylaxis given she does feel short of breath but there is no stridor, no tachypnea, uvula is midline there is no oropharyngeal swelling or change in phonation.  Will start with symptomatic treatment and monitor closely, if she continues feeling short of breath I will administer the EpiPen due to concern for possible anaphylaxis.  I reevaluate patient 5 minutes after getting her medications, she feels significantly proved.  The urticaria is resolved.  No breathing issues.  Will continue to observe cardiac  monitoring and make sure she improves, I do not think she is in anaphylaxis currently and suspect it was urticaria secondary to an allergic response.  Patient has been observed for over an hour without any return of symptoms, no airway involvement.  I do feel she is appropriate for close outpatient follow-up, EpiPen was refilled, will have her follow-up with her PCP for repeat allergy testing.  Strict term cautions discussed with patient verbalized understanding.        Final Clinical Impression(s) / ED Diagnoses Final diagnoses:  None    Rx / DC Orders ED Discharge Orders     None         Sherrill Raring, Hershal Coria 09/26/22 1919    Milton Ferguson, MD 09/27/22 1136

## 2022-09-26 NOTE — ED Notes (Signed)
IVF without difficulty.

## 2022-09-26 NOTE — ED Triage Notes (Signed)
Pt has rash  break out on legs and arm.  Feels SOB and chest pressure and throat feels tight

## 2022-10-07 ENCOUNTER — Encounter (HOSPITAL_COMMUNITY): Payer: Self-pay

## 2022-10-07 ENCOUNTER — Encounter: Payer: Self-pay | Admitting: Nurse Practitioner

## 2022-10-07 NOTE — Progress Notes (Signed)
Noted  

## 2022-10-15 ENCOUNTER — Ambulatory Visit
Admission: EM | Admit: 2022-10-15 | Discharge: 2022-10-15 | Disposition: A | Discharge status: Home / Self Care | Payer: Medicare Other | Attending: Nurse Practitioner | Admitting: Nurse Practitioner

## 2022-10-15 DIAGNOSIS — R399 Unspecified symptoms and signs involving the genitourinary system: Secondary | ICD-10-CM

## 2022-10-15 LAB — POCT URINALYSIS DIP (MANUAL ENTRY)
Bilirubin, UA: NEGATIVE
Blood, UA: NEGATIVE
Glucose, UA: >=1000 mg/dL — AB
Ketones, POC UA: NEGATIVE mg/dL
Nitrite, UA: NEGATIVE
Protein Ur, POC: NEGATIVE mg/dL
Spec Grav, UA: >=1.03 — AB (ref 1.010–1.025)
Urobilinogen, UA: 0.2 E.U./dL
pH, UA: 6.5 (ref 5.0–8.0)

## 2022-10-15 MED ORDER — SULFAMETHOXAZOLE-TRIMETHOPRIM 800-160 MG PO TABS: 1.0000 | ORAL_TABLET | Freq: Two times a day (BID) | ORAL | 0 refills | Status: AC

## 2022-10-15 MED ORDER — ONDANSETRON 4 MG PO TBDP: 4.0000 mg | ORAL_TABLET | Freq: Three times a day (TID) | ORAL | 0 refills | Status: DC | PRN

## 2022-10-15 NOTE — ED Triage Notes (Addendum)
Pt c/o uti sx's, pt states she is taking medication that can cause a uti as a side affect, pt is having strong order from urine nausea and back pain urinary frequency and burning .   Jenny Combs -- her PCP told her it could cause UTI/ Kidney infections

## 2022-10-15 NOTE — Discharge Instructions (Addendum)
-  The urinalysis performed today shows that you need to increase your water intake.  A urine culture has been ordered to confirm whether or not you have a urinary tract infection.  If the culture results are negative, and you are continuing to experience symptoms, please follow-up with your primary care physician for further evaluation.  Also, if the culture results shows that you need to change or be started on a new antibiotic, you will be contacted. -Take medications as prescribed. -Increase fluids. Try to drink at least 8 to 10 eight ounce glasses of water while symptoms persist.  -Ibuprofen or Tylenol for pain, fever, or general discomfort. -Develop a toileting schedule that will allow you to urinate at least every 2 hours. -Avoid caffeine to include tea, soda, and coffee. -If sexually active, void at least 15 to 20 minutes after sexual intercourse. -Follow-up in the emergency department if you develop fever, chills, abdominal pain, worsening nausea, worsening flank pain or pain around your kidneys or other concerns.

## 2022-10-15 NOTE — ED Provider Notes (Signed)
[facility]    CSN: [id number] Arrival date & time: 10/15/22  1215      History   Chief Complaint No chief complaint on file.   HPI Jenny Combs is a 52 y.o. female.   The history is provided by the patient.   The patient presents for complaints of burning with urination, urinary frequency, foul-smelling urine, nausea, and low back pain.  Symptoms started over the past 24 hours.  Patient states that her primary care physician started her on Farxiga and he advised at that time that it could cause UTIs for kidney infections.  Patient denies fever, chills, chest pain, abdominal pain, hematuria, decreased urine stream, vaginal symptoms, flank pain, vomiting, or diarrhea.  She states that her last UTI was last year sometime.  She denies any recent use of antibiotics.  Past Medical History:  Diagnosis Date   Arthritis    Back pain    Bipolar 1 disorder    Diabetes mellitus without complication    Fibromyalgia    PTSD (post-traumatic stress disorder)     There are no problems to display for this patient.   Past Surgical History:  Procedure Laterality Date   CESAREAN SECTION     COMPARTMENT SYNDROME MEASUREMENT      OB History   No obstetric history on file.      Home Medications    Prior to Admission medications   Medication Sig Start Date End Date Taking? Authorizing Provider  FARXIGA 10 MG TABS tablet Take 10 mg by mouth daily. 10/07/22  Yes [provider]  ondansetron (ZOFRAN-ODT) 4 MG disintegrating tablet Take 1 tablet (4 mg total) by mouth every 8 (eight) hours as needed. 10/15/22  Yes Navjot Pilgrim-Warren, Sadie Haber, NP  sulfamethoxazole-trimethoprim (BACTRIM DS) 800-160 MG tablet Take 1 tablet by mouth 2 (two) times daily for 7 days. 10/15/22 10/22/22 Yes Viridiana Spaid-Warren, Sadie Haber, NP  ALPRAZolam Prudy Feeler) 0.5 MG tablet Take 0.5 mg by mouth 2 (two) times daily.    [provider]  Aspirin-Acetaminophen-Caffeine (GOODY HEADACHE PO) Take 1 each by  mouth 3 (three) times daily as needed (headaches).    [provider]  cyanocobalamin (VITAMIN B12) 1000 MCG/ML injection Inject into the muscle. Patient not taking: Reported on 09/26/2022 07/08/22   [provider]  cyclobenzaprine (FLEXERIL) 5 MG tablet Take by mouth. 02/24/22   [provider]  EPINEPHrine 0.3 mg/0.3 mL IJ SOAJ injection Inject 0.3 mg into the muscle as needed for anaphylaxis. 09/26/22   Theron Arista, PA-C  metFORMIN (GLUCOPHAGE) 500 MG tablet Take 500 mg by mouth 2 (two) times daily with a meal.    [provider]  ondansetron (ZOFRAN) 4 MG tablet Take 1 tablet (4 mg total) by mouth every 6 (six) hours as needed for nausea. 09/19/18   Loren Racer, MD  oxyCODONE-acetaminophen (PERCOCET) 10-325 MG tablet Take 1 tablet by mouth every 4 (four) hours as needed for pain.    [provider]  Semaglutide, 2 MG/DOSE, (OZEMPIC, 2 MG/DOSE,) 8 MG/3ML SOPN Inject 2 mg into the skin once a week. Patient states that she is taking 125 weekly    [provider]  traMADol (ULTRAM) 50 MG tablet Take 50 mg by mouth every 6 (six) hours as needed for moderate pain.    [provider]    Family History Family History  Problem Relation Age of Onset   Diabetes Mother    Cancer Mother    Hypertension Mother    Diabetes  Father    Cancer Father    Hypertension Father     Social History Social History   Tobacco Use   Smoking status: Never   Smokeless tobacco: Never  Vaping Use   Vaping Use: Never used  Substance Use Topics   Alcohol use: No   Drug use: Never     Allergies   Atorvastatin, Ibuprofen, Molds & smuts, and Statins   Review of Systems Review of Systems Per HPI  Physical Exam Triage Vital Signs ED Triage Vitals  Enc Vitals Group     BP 10/15/22 1301 111/73     Pulse Rate 10/15/22 1301 93     Resp 10/15/22 1301 15     Temp 10/15/22 1301 97.7 F (36.5 C)     Temp Source 10/15/22 1301 Oral     SpO2  10/15/22 1301 97 %     Weight --      Height --      Head Circumference --      Peak Flow --      Pain Score 10/15/22 1302 7     Pain Loc --      Pain Edu? --      Excl. in GC? --    No data found.  Updated Vital Signs BP 111/73 (BP Location: Right Arm)   Pulse 93   Temp 97.7 F (36.5 C) (Oral)   Resp 15   LMP 10/11/2018 (Exact Date)   SpO2 97%   Visual Acuity Right Eye Distance:   Left Eye Distance:   Bilateral Distance:    Right Eye Near:   Left Eye Near:    Bilateral Near:     Physical Exam Vitals and nursing note reviewed.  Constitutional:      Appearance: Normal appearance. She is not toxic-appearing.  HENT:     Head: Normocephalic.  Eyes:     Extraocular Movements: Extraocular movements intact.     Pupils: Pupils are equal, round, and reactive to light.  Cardiovascular:     Rate and Rhythm: Normal rate and regular rhythm.     Pulses: Normal pulses.     Heart sounds: Normal heart sounds.  Pulmonary:     Effort: Pulmonary effort is normal. No respiratory distress.     Breath sounds: Normal breath sounds. No stridor. No wheezing, rhonchi or rales.  Abdominal:     General: Bowel sounds are normal.     Palpations: Abdomen is soft.     Tenderness: There is abdominal tenderness. There is right CVA tenderness. There is no left CVA tenderness.  Musculoskeletal:     Cervical back: Normal range of motion.  Lymphadenopathy:     Cervical: No cervical adenopathy.  Skin:    General: Skin is warm and dry.  Neurological:     General: No focal deficit present.     Mental Status: She is alert and oriented to person, place, and time.  Psychiatric:        Mood and Affect: Mood normal.        Behavior: Behavior normal.      UC Treatments / Results  Labs (all labs ordered are listed, but only abnormal results are displayed) Labs Reviewed  POCT URINALYSIS DIP (MANUAL ENTRY) - Abnormal; Notable for the following components:      Result Value   Clarity, UA hazy (*)     Glucose, UA >=1,000 (*)    Spec Grav, UA >=1.030 (*)    Leukocytes, UA Trace (*)  All other components within normal limits  URINE CULTURE    EKG   Radiology No results found.  Procedures Procedures (including critical care time)  Medications Ordered in UC Medications - No data to display  Initial Impression / Assessment and Plan / UC Course  I have reviewed the triage vital signs and the nursing notes.  Pertinent labs & imaging results that were available during my care of the patient were reviewed by me and considered in my medical decision making (see chart for details).  The patient is well-appearing, she is in no acute distress, vital signs are stable.  Urinalysis shows trace leukocytes, elevated specific gravity, and hazy appearance.  Urine culture is pending.  Patient does have glucosuria, but this most likely is related to use of the [place] she is taking.  Patient with moderate right CVA tenderness on exam, and symptoms are consistent with UTI.  Given the patient's presentation, will treat for UTI/possible pyelonephritis with Bactrim DS 800/160 mg tablets.  Urine culture is pending.  Patient was advised that if the culture results are negative, and she continues to experience symptoms, she will need to follow-up with her PCP.  Supportive care recommendations were provided and discussed with the patient to include increasing her water intake to 8-10 8 ounce glasses daily, developing a toileting schedule, and voiding after sexual intercourse.  Patient was given strict ER follow-up precautions.  Patient is in agreement with this plan of care and verbalizes understanding.  All questions were answered.  Patient stable for discharge.  Final Clinical Impressions(s) / UC Diagnoses   Final diagnoses:  UTI symptoms     Discharge Instructions      -The urinalysis performed today shows that you need to increase your water intake.  A urine culture has been ordered to confirm  whether or not you have a urinary tract infection.  If the culture results are negative, and you are continuing to experience symptoms, please follow-up with your primary care physician for further evaluation.  Also, if the culture results shows that you need to change or be started on a new antibiotic, you will be contacted. -Take medications as prescribed. -Increase fluids. Try to drink at least 8 to 10 eight ounce glasses of water while symptoms persist.  -Ibuprofen or Tylenol for pain, fever, or general discomfort. -Develop a toileting schedule that will allow you to urinate at least every 2 hours. -Avoid caffeine to include tea, soda, and coffee. -If sexually active, void at least 15 to 20 minutes after sexual intercourse. -Follow-up in the emergency department if you develop fever, chills, abdominal pain, worsening nausea, worsening flank pain or pain around your kidneys or other concerns.      ED Prescriptions     Medication Sig Dispense Auth. Provider   sulfamethoxazole-trimethoprim (BACTRIM DS) 800-160 MG tablet Take 1 tablet by mouth 2 (two) times daily for 7 days. 14 tablet Alaisha Eversley-Warren, Sadie Haber, NP   ondansetron (ZOFRAN-ODT) 4 MG disintegrating tablet Take 1 tablet (4 mg total) by mouth every 8 (eight) hours as needed. 20 tablet Charlette Hennings-Warren, Sadie Haber, NP      PDMP not reviewed this encounter.   Abran Cantor, NP 10/15/22 [phone]

## 2022-10-16 ENCOUNTER — Ambulatory Visit: Payer: Medicare Other | Admitting: Nurse Practitioner

## 2022-10-17 ENCOUNTER — Telehealth: Payer: Self-pay | Admitting: Emergency Medicine

## 2022-10-17 ENCOUNTER — Ambulatory Visit: Payer: Medicare Other | Admitting: Nurse Practitioner

## 2022-10-17 ENCOUNTER — Encounter: Payer: Self-pay | Admitting: Nurse Practitioner

## 2022-10-17 VITALS — BP 112/70 | HR 90 | Ht 71.0 in | Wt 222.0 lb

## 2022-10-17 DIAGNOSIS — E042 Nontoxic multinodular goiter: Secondary | ICD-10-CM

## 2022-10-17 LAB — URINE CULTURE: Culture: 20000 — AB

## 2022-10-17 MED ORDER — CIPROFLOXACIN HCL 500 MG PO TABS: 500.0000 mg | ORAL_TABLET | Freq: Two times a day (BID) | ORAL | 0 refills | Status: AC

## 2022-10-17 NOTE — Telephone Encounter (Signed)
Urine culture resulted and is resistant to antibiotic prescribed.  Cipro  called in for patient

## 2022-10-17 NOTE — Progress Notes (Signed)
Endocrinology Follow Up Note 10/17/22    ---------------------------------------------------------------------------------------------------------------------- Subjective    Past Medical History:  Diagnosis Date   Arthritis    Back pain    Bipolar 1 disorder    Diabetes mellitus without complication    Fibromyalgia    PTSD (post-traumatic stress disorder)     Past Surgical History:  Procedure Laterality Date   CESAREAN SECTION     COMPARTMENT SYNDROME MEASUREMENT      Social History   Socioeconomic History   Marital status: Married    Spouse name: Not on file   Number of children: Not on file   Years of education: Not on file   Highest education level: Not on file  Occupational History   Not on file  Tobacco Use   Smoking status: Never   Smokeless tobacco: Never  Vaping Use   Vaping Use: Never used  Substance and Sexual Activity   Alcohol use: No   Drug use: Never   Sexual activity: Yes    Birth control/protection: None  Other Topics Concern   Not on file  Social History Narrative   Not on file   Social Determinants of Health   Financial Resource Strain: Not on file  Food Insecurity: Not on file  Transportation Needs: Not on file  Physical Activity: Not on file  Stress: Not on file  Social Connections: Not on file  Intimate Partner Violence: Not on file    Current Outpatient Medications on File Prior to Visit  Medication Sig Dispense Refill   ALPRAZolam (XANAX) 0.5 MG tablet Take 0.5 mg by mouth 2 (two) times daily.     Aspirin-Acetaminophen-Caffeine (GOODY HEADACHE PO) Take 1 each by mouth 3 (three) times daily as needed (headaches).     cyanocobalamin (VITAMIN B12) 1000 MCG/ML injection Inject into the muscle.     cyclobenzaprine (FLEXERIL) 5 MG tablet Take by mouth.     EPINEPHrine 0.3 mg/0.3 mL IJ SOAJ injection Inject 0.3 mg into the muscle as needed for anaphylaxis. 1 each 0   metFORMIN (GLUCOPHAGE) 500 MG tablet Take 500 mg by mouth 2 (two)  times daily with a meal.     ondansetron (ZOFRAN) 4 MG tablet Take 1 tablet (4 mg total) by mouth every 6 (six) hours as needed for nausea. 12 tablet 0   ondansetron (ZOFRAN-ODT) 4 MG disintegrating tablet Take 1 tablet (4 mg total) by mouth every 8 (eight) hours as needed. 20 tablet 0   oxyCODONE-acetaminophen (PERCOCET) 10-325 MG tablet Take 1 tablet by mouth every 4 (four) hours as needed for pain.     sulfamethoxazole-trimethoprim (BACTRIM DS) 800-160 MG tablet Take 1 tablet by mouth 2 (two) times daily for 7 days. 14 tablet 0   traMADol (ULTRAM) 50 MG tablet Take 50 mg by mouth every 6 (six) hours as needed for moderate pain.     FARXIGA 10 MG TABS tablet Take 10 mg by mouth daily. (Patient not taking: Reported on 10/17/2022)     Semaglutide, 2 MG/DOSE, (OZEMPIC, 2 MG/DOSE,) 8 MG/3ML SOPN Inject 2 mg into the skin once a week. Patient states that she is taking 125 weekly (Patient not taking: Reported on 10/17/2022)     No current facility-administered medications on file prior to visit.      HPI   Jenny Combs is a 52 y.o.-year-old female, referred by her PCP, for evaluation for multinodular goiter.  She was first diagnosed with thyroid nodules incidentally after CT scan in 2012 at [facility] after fall on  her porch.  She had ultrasound which showed several small nodules.  She notes her life got crazy and her thyroid condition got pushed to the back burner until recently, she was inquiring about a follow up for those nodules with her PCP.  Thyroid U/S: THYROID ULTRASOUND 07/30/22  INDICATION: Thyroid nodule   COMPARISON: None   TECHNIQUE: Gray-scale and color Doppler images of the thyroid gland were obtained.   FINDINGS:   ISTHMUS:  - Size: 0.3 cm.   RIGHT LOBE:  - Size: 4.2 x 1.6 x 1.2 cm.  - Echogenicity: Normal.   LEFT LOBE:  - Size: 4.4 x 1.4 x 1.5 cm.  - Echogenicity: Normal.   NODULES:   - Nodule 1:  -- Size: 0.7 x 0.6 x 0.6 cm (long x AP x trans).  -- Location: Right  upper pole laterally.   -- Composition: solid or almost completely solid (2)  -- Echogenicity: hypoechoic (2)  -- Shape: wider-than-tall (0)  -- Margins: smooth (0)  -- Echogenic foci: none (0)   -- ACR TI-RADS total points and risk category: 4 Points - TR4.   - Nodule 2:  -- Size: 1.5 x 0.6 x 0.9 cm (long x AP x trans).  -- Location: Right midpole .   -- Composition: solid or almost completely solid (2)  -- Echogenicity: hypoechoic (2)  -- Shape: wider-than-tall (0)  -- Margins: smooth (0)  -- Echogenic foci: none (0)   -- ACR TI-RADS total points and risk category: 4 Points - TR4.   - Nodule 3:  -- Size: 1.1 x 1 x 1 cm (long x AP x trans).  -- Location: Left mid pole medially .   -- Composition: solid or almost completely solid (2)  -- Echogenicity: hypoechoic (2)  -- Shape: wider-than-tall (0)  -- Margins: lobulated/irregular (2)  -- Echogenic foci: punctate echogenic foci (3)   -- ACR TI-RADS total points and risk category: 9 Points - TR5.   - Nodule 4:  -- Size: 1.1 x 0.7 x 0.8 cm (long x AP x trans).  -- Location: Left lateral mid gland .   -- Composition: solid or almost completely solid (2)  -- Echogenicity: hypoechoic (2)  -- Shape: wider-than-tall (0)  -- Margins: lobulated/irregular (2)  -- Echogenic foci: macrocalcifications (1)   -- ACR TI-RADS total points and risk category: 7 Points - TR5.   - Nodule 5:  -- Size: 0.9 x 0.6 x 0.7 cm (long x AP x trans).  -- Location: Left inferior lateral .   -- Composition: solid or almost completely solid (2)  -- Echogenicity: hypoechoic (2)  -- Shape: wider-than-tall (0)  -- Margins: smooth (0)  -- Echogenic foci: macrocalcifications (1)   -- ACR TI-RADS total points and risk category: 5 Points - TR4.   MISCELLANEOUS:  N/A.    IMPRESSION:  1. Nodule 1: ACR TI-RADS 4: Recommendation: No further follow-up is recommended.  2.  Nodule 2: ACR TI-RADS 4: Recommendation: Follow-up ultrasound in 1 year.  3.   Nodule 3: ACR TI-RADS 5: Recommendation: Ultrasound-guided fine needle aspiration.  4.  Nodule 4: ACR TI-RADS 5: Recommendation: Ultrasound-guided fine needle aspiration.  5.  Nodule 5: ACR TI-RADS 4: Recommendation: Follow-up ultrasound in one year.    I reviewed pt's thyroid tests: Lab Results  Component Value Date   TSH 0.834 09/15/2022   FREET4 1.04 09/15/2022     Pt denies any real symptoms of thyroid dysfunction other than weight gain.  She denies: - feeling  nodules in neck - hoarseness - dysphagia - choking - SOB with lying down  She does have family history of thyroid disease in her mom and her daughter (thinks underactive). No FH of thyroid cancer. No h/o radiation tx to head or neck.  No seaweed or kelp. No recent contrast studies. No steroid use. No herbal supplements. No Biotin supplements or Hair, Skin and Nails vitamins.  Pt also has a history of bipolar disorder, anxiety, HTN, low vitamin D, HLD, neuropathy, type 2 Diabetes, chronic pain.  Review of systems  Constitutional: + Minimally fluctuating body weight,  current Body mass index is 30.96 kg/m. , no fatigue, no subjective hyperthermia, no subjective hypothermia Eyes: no blurry vision, no xerophthalmia ENT: no sore throat, no nodules palpated in throat, no dysphagia/odynophagia, no hoarseness Cardiovascular: no chest pain, no shortness of breath, no palpitations, no leg swelling Respiratory: no cough, no shortness of breath Gastrointestinal: no nausea/vomiting/diarrhea Musculoskeletal: no muscle/joint aches Skin: no rashes, no hyperemia Neurological: no tremors, no numbness, no tingling, no dizziness Psychiatric: no depression, no anxiety  ---------------------------------------------------------------------------------------------------------------------- Objective    BP 112/70 (BP Location: Right Arm, Patient Position: Sitting, Cuff Size: Large)   Pulse 90   Ht  (1.803 m)   Wt 222 lb (100.7 kg)    LMP 10/11/2018 (Exact Date)   BMI 30.96 kg/m    BP Readings from Last 3 Encounters:  10/17/22 112/70  10/15/22 111/73  09/26/22 120/72    Wt Readings from Last 3 Encounters:  10/17/22 222 lb (100.7 kg)  09/26/22 190 lb (86.2 kg)  09/15/22 222 lb 6.4 oz (100.9 kg)     Physical Exam- Limited  Constitutional:  Body mass index is 30.96 kg/m. , not in acute distress, mildly anxious state of mind Eyes:  EOMI, no exophthalmos Neck: Supple Musculoskeletal: no gross deformities, strength intact in all four extremities, no gross restriction of joint movements Skin:  no rashes, no hyperemia Neurological: no tremor with outstretched hands         Latest Reference Range & Units 09/15/22 14:08  TSH 0.450 - 4.500 uIU/mL 0.834  Triiodothyronine,Free,Serum 2.0 - 4.4 pg/mL 2.8  T4,Free(Direct) 0.82 - 1.77 ng/dL 9.56  Thyroperoxidase Ab SerPl-aCnc 0 - 34 IU/mL 11  Thyroglobulin Antibody 0.0 - 0.9 IU/mL <1.0   ----------------------------------------------------------------------------------------------------------------------  ASSESSMENT / PLAN:  1. Thyroid Nodule  - I reviewed the images of her thyroid ultrasound along with the patient. I pointed out that the dominant nodules are small, not typically being risky for cancer.  2 of the nodules have irregular margins, lobular shaped and have macrocalcifications, this being a risk factor for cancer.   She had thyroid biopsy on 2 nodules, 1 was sent to [facility] due to abnormal cells but determined to be benign, the other was determined to be benign based on preliminary results.  She will need follow up in 1 year with repeat ultrasound to observe the other 1 mildly suspicious nodules.  Her thyroid blood tests were WNL, indicating she has a normal functioning thyroid gland.  Antibodies were negative, ruling out autoimmune thyroid dysfunction.   She notes she would like to see seen here for management of her diabetes.  I asked that she  call her PCP and get all pertinent information sent to [place] for review and we will schedule appt.  FOLLOW UP PLAN: Return wants to be seen for DM management too, needs records faxed here and we can schedule follow up.     I spent  36  minutes in the care of the patient today including review of labs from Thyroid Function, CMP, and other relevant labs ; imaging/biopsy records (current and previous including abstractions from other facilities); face-to-face time discussing  her lab results and symptoms, medications doses, her options of short and long term treatment based on the latest standards of care / guidelines;   and documenting the encounter.  Tollie Pizza  participated in the discussions, expressed understanding, and voiced agreement with the above plans.  All questions were answered to her satisfaction. she is encouraged to contact clinic should she have any questions or concerns prior to her return visit.    Ronny Bacon, [facility] [address] [place], [place] [address] Phone: [phone] Fax: [phone]

## 2022-11-04 ENCOUNTER — Ambulatory Visit
Admission: RE | Admit: 2022-11-04 | Discharge: 2022-11-04 | Disposition: A | Discharge status: Home / Self Care | Payer: Medicare Other | Source: Ambulatory Visit

## 2022-11-04 VITALS — BP 125/72 | HR 92 | Temp 97.6°F | Resp 18

## 2022-11-04 DIAGNOSIS — J069 Acute upper respiratory infection, unspecified: Secondary | ICD-10-CM | POA: Diagnosis not present

## 2022-11-04 DIAGNOSIS — J029 Acute pharyngitis, unspecified: Secondary | ICD-10-CM

## 2022-11-04 DIAGNOSIS — Z20818 Contact with and (suspected) exposure to other bacterial communicable diseases: Secondary | ICD-10-CM

## 2022-11-04 LAB — POCT RAPID STREP A (OFFICE): Rapid Strep A Screen: NEGATIVE

## 2022-11-04 MED ORDER — LIDOCAINE VISCOUS HCL 2 % MT SOLN: 15.0000 mL | OROMUCOSAL | 0 refills | Status: AC | PRN

## 2022-11-04 MED ORDER — BENZONATATE 100 MG PO CAPS: 100.0000 mg | ORAL_CAPSULE | Freq: Three times a day (TID) | ORAL | 0 refills | Status: AC | PRN

## 2022-11-04 NOTE — Discharge Instructions (Addendum)
You have a viral upper respiratory infection.  Symptoms should improve over the next week to 10 days.  If you develop chest pain or shortness of breath, go to the emergency room.  We have tested you today for strep throat and the rapid test today is negative.  The throat culture is pending.  You will see the results in Mychart and we will call you with positive results.   Some things that can make you feel better are: - Increased rest - Increasing fluid with water/sugar free electrolytes - Acetaminophen and ibuprofen as needed for fever/pain - Salt water gargling, chloraseptic spray and throat lozenges for sore throat - Lidocaine rinses for sore throat - OTC guaifenesin (Mucinex) 600 mg twice daily - Saline sinus flushes or a neti pot - Humidifying the air -Tessalon Perles during the day as needed for dry cough

## 2022-11-04 NOTE — ED Provider Notes (Signed)
[facility]    CSN: [id number] Arrival date & time: 11/04/22  1356      History   Chief Complaint No chief complaint on file.   HPI Jenny Combs is a 52 y.o. female.   Patient presents today with 1 day history of tactile fever, chills, postnasal drainage and sore throat as well as headache and decreased appetite.  She denies known fevers, cough, runny or stuffy nose, ear pain or drainage, abdominal pain, nausea/vomiting, and diarrhea.  No loss of taste or smell.  She has been a bit more tired than normal today.  Reports she has been exposed to strep throat by her grand children and husband for the past month.  Has not taken anything for symptoms so far.    Past Medical History:  Diagnosis Date   Arthritis    Back pain    Bipolar 1 disorder (HCC)    Diabetes mellitus without complication (HCC)    Fibromyalgia    PTSD (post-traumatic stress disorder)     There are no problems to display for this patient.   Past Surgical History:  Procedure Laterality Date   CESAREAN SECTION     COMPARTMENT SYNDROME MEASUREMENT      OB History   No obstetric history on file.      Home Medications    Prior to Admission medications   Medication Sig Start Date End Date Taking? Authorizing Provider  benzonatate (TESSALON) 100 MG capsule Take 1 capsule (100 mg total) by mouth 3 (three) times daily as needed for cough. Do not take with alcohol or while driving or operating heavy machinery.  May cause drowsiness. 11/04/22  Yes Cathlean Marseilles A, NP  lidocaine (XYLOCAINE) 2 % solution Use as directed 15 mLs in the mouth or throat every 3 (three) hours as needed for mouth pain. Gargle and spit as needed for throat pain 11/04/22  Yes Cathlean Marseilles A, NP  sitaGLIPtin (JANUVIA) 25 MG tablet Take 25 mg by mouth daily.   Yes [provider]  ALPRAZolam Prudy Feeler) 0.5 MG tablet Take 0.5 mg by mouth 2 (two) times daily.    [provider]  Aspirin-Acetaminophen-Caffeine  (GOODY HEADACHE PO) Take 1 each by mouth 3 (three) times daily as needed (headaches).    [provider]  cyanocobalamin (VITAMIN B12) 1000 MCG/ML injection Inject into the muscle. 07/08/22   [provider]  cyclobenzaprine (FLEXERIL) 5 MG tablet Take by mouth. 02/24/22   [provider]  EPINEPHrine 0.3 mg/0.3 mL IJ SOAJ injection Inject 0.3 mg into the muscle as needed for anaphylaxis. 09/26/22   Theron Arista, PA-C  FARXIGA 10 MG TABS tablet Take 10 mg by mouth daily. Patient not taking: Reported on 10/17/2022 10/07/22   [provider]  metFORMIN (GLUCOPHAGE) 500 MG tablet Take 500 mg by mouth 2 (two) times daily with a meal.    [provider]  ondansetron (ZOFRAN) 4 MG tablet Take 1 tablet (4 mg total) by mouth every 6 (six) hours as needed for nausea. 09/19/18   Loren Racer, MD  ondansetron (ZOFRAN-ODT) 4 MG disintegrating tablet Take 1 tablet (4 mg total) by mouth every 8 (eight) hours as needed. 10/15/22   Leath-Warren, Sadie Haber, NP  oxyCODONE-acetaminophen (PERCOCET) 10-325 MG tablet Take 1 tablet by mouth every 4 (four) hours as needed for pain.    [provider]  traMADol (ULTRAM) 50 MG tablet Take 50 mg by mouth every 6 (six) hours as needed for moderate pain.  [provider]    Family History Family History  Problem Relation Age of Onset   Diabetes Mother    Cancer Mother    Hypertension Mother    Diabetes Father    Cancer Father    Hypertension Father     Social History Social History   Tobacco Use   Smoking status: Never   Smokeless tobacco: Never  Vaping Use   Vaping Use: Never used  Substance Use Topics   Alcohol use: No   Drug use: Never     Allergies   Atorvastatin, Ibuprofen, Molds & smuts, and Statins   Review of Systems Review of Systems Per HPI  Physical Exam Triage Vital Signs ED Triage Vitals  Enc Vitals Group     BP 11/04/22 1400 125/72     Pulse Rate 11/04/22 1400 92      Resp 11/04/22 1400 18     Temp 11/04/22 1400 97.6 F (36.4 C)     Temp Source 11/04/22 1400 Oral     SpO2 11/04/22 1400 97 %     Weight --      Height --      Head Circumference --      Peak Flow --      Pain Score 11/04/22 1401 5     Pain Loc --      Pain Edu? --      Excl. in GC? --    No data found.  Updated Vital Signs BP 125/72 (BP Location: Right Arm)   Pulse 92   Temp 97.6 F (36.4 C) (Oral)   Resp 18   LMP 10/11/2018 (Exact Date)   SpO2 97%   Visual Acuity Right Eye Distance:   Left Eye Distance:   Bilateral Distance:    Right Eye Near:   Left Eye Near:    Bilateral Near:     Physical Exam Vitals and nursing note reviewed.  Constitutional:      General: She is not in acute distress.    Appearance: Normal appearance. She is not ill-appearing or toxic-appearing.  HENT:     Head: Normocephalic and atraumatic.     Right Ear: Tympanic membrane, ear canal and external ear normal.     Left Ear: Tympanic membrane, ear canal and external ear normal.     Nose: No congestion or rhinorrhea.     Mouth/Throat:     Mouth: Mucous membranes are moist.     Pharynx: Oropharynx is clear. Posterior oropharyngeal erythema present. No oropharyngeal exudate.  Eyes:     General: No scleral icterus.    Extraocular Movements: Extraocular movements intact.  Cardiovascular:     Rate and Rhythm: Normal rate and regular rhythm.  Pulmonary:     Effort: Pulmonary effort is normal. No respiratory distress.     Breath sounds: Normal breath sounds. No wheezing, rhonchi or rales.  Abdominal:     General: Abdomen is flat. Bowel sounds are normal.     Palpations: Abdomen is soft.  Musculoskeletal:     Cervical back: Normal range of motion and neck supple.  Lymphadenopathy:     Cervical: No cervical adenopathy.  Skin:    General: Skin is warm and dry.     Coloration: Skin is not jaundiced or pale.     Findings: No erythema or rash.  Neurological:     Mental Status: She is alert and  oriented to person, place, and time.  Psychiatric:        Behavior: Behavior  is cooperative.      UC Treatments / Results  Labs (all labs ordered are listed, but only abnormal results are displayed) Labs Reviewed  POCT RAPID STREP A (OFFICE) - Normal  CULTURE, GROUP A STREP [facility])    EKG   Radiology No results found.  Procedures Procedures (including critical care time)  Medications Ordered in UC Medications - No data to display  Initial Impression / Assessment and Plan / UC Course  I have reviewed the triage vital signs and the nursing notes.  Pertinent labs & imaging results that were available during my care of the patient were reviewed by me and considered in my medical decision making (see chart for details).   Patient is well-appearing, normotensive, afebrile, not tachycardic, not tachypneic, oxygenating well on room air.    1. Acute pharyngitis, unspecified etiology 2. Exposure to strep throat 3. Viral URI Rapid strep throat test is negative today Throat culture is pending Centor score today is 1 Start lidocaine rinses as well as cough suppressant Suspect viral etiology COVID-19 test deferred today Supportive care discussed ER and return precautions discussed with patient  The patient was given the opportunity to ask questions.  All questions answered to their satisfaction.  The patient is in agreement to this plan.    Final Clinical Impressions(s) / UC Diagnoses   Final diagnoses:  Acute pharyngitis, unspecified etiology  Exposure to strep throat  Viral URI     Discharge Instructions      You have a viral upper respiratory infection.  Symptoms should improve over the next week to 10 days.  If you develop chest pain or shortness of breath, go to the emergency room.  We have tested you today for strep throat and the rapid test today is negative.  The throat culture is pending.  You will see the results in Mychart and we will call you with positive  results.   Some things that can make you feel better are: - Increased rest - Increasing fluid with water/sugar free electrolytes - Acetaminophen and ibuprofen as needed for fever/pain - Salt water gargling, chloraseptic spray and throat lozenges for sore throat - Lidocaine rinses for sore throat - OTC guaifenesin (Mucinex) 600 mg twice daily - Saline sinus flushes or a neti pot - Humidifying the air -Tessalon Perles during the day as needed for dry cough    ED Prescriptions     Medication Sig Dispense Auth. Provider   lidocaine (XYLOCAINE) 2 % solution Use as directed 15 mLs in the mouth or throat every 3 (three) hours as needed for mouth pain. Gargle and spit as needed for throat pain 100 mL Cathlean Marseilles A, NP   benzonatate (TESSALON) 100 MG capsule Take 1 capsule (100 mg total) by mouth 3 (three) times daily as needed for cough. Do not take with alcohol or while driving or operating heavy machinery.  May cause drowsiness. 21 capsule Valentino Nose, NP      PDMP not reviewed this encounter.   Valentino Nose, NP 11/04/22 [phone]

## 2022-11-04 NOTE — ED Triage Notes (Signed)
Exposed to strep from grandchild and husband.  Head and sore throat since yesterday

## 2022-11-07 LAB — CULTURE, GROUP A STREP (THRC)

## 2022-11-13 ENCOUNTER — Telehealth: Payer: Self-pay | Admitting: Nurse Practitioner

## 2022-11-13 NOTE — Telephone Encounter (Signed)
Pt called and said that you told her you would see her for her DM- she is having high sugars, she is having her A1c Faxed over. I have her scheduled for June 11 but she called back and said that she is going out of town June 10 and not coming back until July 13. Would you overbook for her?

## 2022-11-14 NOTE — Telephone Encounter (Signed)
Noted. I will call pt

## 2022-11-14 NOTE — Telephone Encounter (Signed)
I literally have no chart notes or labs to go by to know how to help at this point.  But she should be taking her Januvia in the morning, not at night.

## 2022-11-14 NOTE — Telephone Encounter (Signed)
Pt said that her sugar last night as 145, she took [place] and had 2 crackers, she woke up this morning and it was 236. She is going to see her PCP this morning. He wants to put her on insulin but she does not want to be on insulin. 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 Please Advise.

## 2022-11-14 NOTE — Telephone Encounter (Signed)
No, we can wait til the next available or put her on the waiting list.

## 2022-12-09 ENCOUNTER — Ambulatory Visit: Payer: Medicare Other | Admitting: Nurse Practitioner

## 2023-01-14 ENCOUNTER — Telehealth: Payer: Self-pay | Admitting: Nurse Practitioner

## 2023-01-14 NOTE — Telephone Encounter (Signed)
Sending a fax request. Could not get anyone on the phone at [employer] office.

## 2023-01-14 NOTE — Telephone Encounter (Signed)
I called pt because she has an appt on 7/30 with Whitney and she stated when I made the appt she would have her PCP fax her labs, we do not have those labs and will need those prior to appt.

## 2023-01-27 ENCOUNTER — Ambulatory Visit: Payer: Medicare Other | Admitting: Nurse Practitioner

## 2023-01-27 DIAGNOSIS — E042 Nontoxic multinodular goiter: Secondary | ICD-10-CM

## 2023-01-27 DIAGNOSIS — E1165 Type 2 diabetes mellitus with hyperglycemia: Secondary | ICD-10-CM

## 2023-01-27 DIAGNOSIS — Z7984 Long term (current) use of oral hypoglycemic drugs: Secondary | ICD-10-CM

## 2023-02-03 NOTE — Patient Instructions (Signed)

## 2023-02-05 ENCOUNTER — Encounter: Payer: Self-pay | Admitting: Nurse Practitioner

## 2023-02-05 ENCOUNTER — Ambulatory Visit: Payer: Medicare Other | Admitting: Nurse Practitioner

## 2023-02-05 VITALS — BP 116/70 | HR 89 | Ht 71.0 in | Wt 220.4 lb

## 2023-02-05 DIAGNOSIS — Z7984 Long term (current) use of oral hypoglycemic drugs: Secondary | ICD-10-CM | POA: Diagnosis not present

## 2023-02-05 DIAGNOSIS — E1165 Type 2 diabetes mellitus with hyperglycemia: Secondary | ICD-10-CM

## 2023-02-05 DIAGNOSIS — Z789 Other specified health status: Secondary | ICD-10-CM

## 2023-02-05 DIAGNOSIS — E042 Nontoxic multinodular goiter: Secondary | ICD-10-CM

## 2023-02-05 MED ORDER — METFORMIN HCL ER 500 MG PO TB24: 1000.0000 mg | ORAL_TABLET | Freq: Every day | ORAL | 3 refills | Status: DC

## 2023-02-05 NOTE — Progress Notes (Signed)
Endocrinology Follow Up Note       02/05/2023, 9:56 AM   Subjective:    Patient ID: Jenny Combs, female    DOB: 07/03/1970.  Jenny Combs is being seen in follow up after being seen in consultation for management of currently uncontrolled symptomatic diabetes and thyroid nodule requested by  Elder Negus, PA-C.   Past Medical History:  Diagnosis Date   Arthritis    Back pain    Bipolar 1 disorder (HCC)    Diabetes mellitus without complication (HCC)    Fibromyalgia    PTSD (post-traumatic stress disorder)     Past Surgical History:  Procedure Laterality Date   CESAREAN SECTION     COMPARTMENT SYNDROME MEASUREMENT      Social History   Socioeconomic History   Marital status: Married    Spouse name: Not on file   Number of children: Not on file   Years of education: Not on file   Highest education level: Not on file  Occupational History   Not on file  Tobacco Use   Smoking status: Never   Smokeless tobacco: Never  Vaping Use   Vaping status: Never Used  Substance and Sexual Activity   Alcohol use: No   Drug use: Never   Sexual activity: Yes    Birth control/protection: None  Other Topics Concern   Not on file  Social History Narrative   Not on file   Social Determinants of Health   Financial Resource Strain: Not on file  Food Insecurity: Not on file  Transportation Needs: Not on file  Physical Activity: Not on file  Stress: Not on file  Social Connections: Unknown (11/10/2021)   Received from [facility], [facility]   Social Network    Social Network: Not on file    Family History  Problem Relation Age of Onset   Diabetes Mother    Cancer Mother    Hypertension Mother    Diabetes Father    Cancer Father    Hypertension Father     Outpatient Encounter Medications as of 02/05/2023  Medication Sig   ALPRAZolam (XANAX) 0.5 MG tablet Take 0.5 mg by mouth 2 (two) times  daily.   Aspirin-Acetaminophen-Caffeine (GOODY HEADACHE PO) Take 1 each by mouth 3 (three) times daily as needed (headaches).   benzonatate (TESSALON) 100 MG capsule Take 1 capsule (100 mg total) by mouth 3 (three) times daily as needed for cough. Do not take with alcohol or while driving or operating heavy machinery.  May cause drowsiness.   cyanocobalamin (VITAMIN B12) 1000 MCG/ML injection Inject into the muscle.   cyclobenzaprine (FLEXERIL) 5 MG tablet Take by mouth.   EPINEPHrine 0.3 mg/0.3 mL IJ SOAJ injection Inject 0.3 mg into the muscle as needed for anaphylaxis.   lidocaine (XYLOCAINE) 2 % solution Use as directed 15 mLs in the mouth or throat every 3 (three) hours as needed for mouth pain. Gargle and spit as needed for throat pain   ondansetron (ZOFRAN) 4 MG tablet Take 1 tablet (4 mg total) by mouth every 6 (six) hours as needed for nausea.   ondansetron (ZOFRAN-ODT) 4 MG disintegrating tablet Take 1 tablet (4 mg  total) by mouth every 8 (eight) hours as needed.   oxyCODONE-acetaminophen (PERCOCET) 10-325 MG tablet Take 1 tablet by mouth every 4 (four) hours as needed for pain.   traMADol (ULTRAM) 50 MG tablet Take 50 mg by mouth every 6 (six) hours as needed for moderate pain.   [DISCONTINUED] metFORMIN (GLUCOPHAGE-XR) 500 MG 24 hr tablet Take 1,000 mg by mouth daily.   metFORMIN (GLUCOPHAGE-XR) 500 MG 24 hr tablet Take 2 tablets (1,000 mg total) by mouth daily.   [DISCONTINUED] FARXIGA 10 MG TABS tablet Take 10 mg by mouth daily. (Patient not taking: Reported on 10/17/2022)   [DISCONTINUED] metFORMIN (GLUCOPHAGE) 500 MG tablet Take 500 mg by mouth 2 (two) times daily with a meal. (Patient not taking: Reported on 02/05/2023)   [DISCONTINUED] sitaGLIPtin (JANUVIA) 25 MG tablet Take 25 mg by mouth daily. (Patient not taking: Reported on 02/05/2023)   No facility-administered encounter medications on file as of 02/05/2023.    ALLERGIES: Allergies  Allergen Reactions   Atorvastatin Other (See  Comments)    Patient reports having Palpitations, shortness of breath, and chest tightness.    Ibuprofen Nausea Only   Molds & Smuts    Statins Palpitations    VACCINATION STATUS: Immunization History  Administered Date(s) Administered   Hepatitis A, Adult 03/31/2017   Hepatitis B, ADULT 03/31/2017   Influenza,inj,Quad PF,6+ Mos 03/31/2017   Pneumococcal Polysaccharide-23 09/10/2015   Tdap 08/19/2015    Diabetes She presents for her initial diabetic visit. She has type 2 diabetes mellitus. Her disease course has been fluctuating. There are no hypoglycemic associated symptoms. Associated symptoms include fatigue. There are no hypoglycemic complications. There are no diabetic complications. Risk factors for coronary artery disease include diabetes mellitus, dyslipidemia, family history, obesity and stress. Current diabetic treatment includes oral agent (monotherapy) (Currently on Metformin 1000 mg ER nightly, Ozempic 1.75 (listening to clicks) weekly). She is compliant with treatment most of the time. Her weight is fluctuating minimally. She is following a generally unhealthy diet. (She presents today with no meter or logs to review.  Her most recent A1c on 7/19 was 8% but she notes she had been on antibiotics for UTI and then steroids for a reaction to the antibiotics prior to that.  She notes her PCP recently put her back on Ozempic after her thyroid nodule was determined not to be caused by the GLP1 med.  She notes she has significant stress, caring for 3 sons, has trouble sleeping through the night, and turns to food for comfort during those times.  She does note there is room for improvement in her dietary choices, does drink zero sugar pepsi and skips meals from time to time.)     Review of systems  Constitutional: + Minimally fluctuating body weight, current Body mass index is 30.74 kg/m., no fatigue, no subjective hyperthermia, no subjective hypothermia Eyes: no blurry vision, no  xerophthalmia ENT: no sore throat, no nodules palpated in throat, no dysphagia/odynophagia, no hoarseness Cardiovascular: no chest pain, no shortness of breath, no palpitations, no leg swelling Respiratory: no cough, no shortness of breath Gastrointestinal: no nausea/vomiting/diarrhea Musculoskeletal: no muscle/joint aches Skin: no rashes, no hyperemia Neurological: no tremors, no numbness, no tingling, no dizziness Psychiatric: no depression, no anxiety  Objective:     BP 116/70 (BP Location: Left Arm, Patient Position: Sitting, Cuff Size: Large)   Pulse 89   Ht 5\' 11"  (1.803 m)   Wt 220 lb 6.4 oz (100 kg)   LMP 10/11/2018 (Exact Date)  BMI 30.74 kg/m   Wt Readings from Last 3 Encounters:  02/05/23 220 lb 6.4 oz (100 kg)  10/17/22 222 lb (100.7 kg)  09/26/22 190 lb (86.2 kg)     BP Readings from Last 3 Encounters:  02/05/23 116/70  11/04/22 125/72  10/17/22 112/70     Physical Exam- Limited  Constitutional:  Body mass index is 30.74 kg/m. , not in acute distress, normal state of mind Eyes:  EOMI, no exophthalmos Neck: Supple Thyroid: No gross goiter Cardiovascular: RRR, no murmurs, rubs, or gallops, no edema Respiratory: Adequate breathing efforts, no crackles, rales, rhonchi, or wheezing Musculoskeletal: no gross deformities, strength intact in all four extremities, no gross restriction of joint movements Skin:  no rashes, no hyperemia Neurological: no tremor with outstretched hands    CMP ( most recent) CMP     Component Value Date/Time   NA 140 05/10/2022 0601   K 4.5 05/10/2022 0601   CL 107 05/10/2022 0601   CO2 27 05/10/2022 0601   GLUCOSE 144 (H) 05/10/2022 0601   BUN 10 05/10/2022 0601   CREATININE 0.80 05/10/2022 0601   CREATININE 0.80 02/23/2017 1053   CALCIUM 9.2 05/10/2022 0601   PROT 6.7 05/10/2022 0601   ALBUMIN 3.5 05/10/2022 0601   AST 15 05/10/2022 0601   ALT 15 05/10/2022 0601   ALKPHOS 46 05/10/2022 0601   BILITOT 0.8 05/10/2022  0601   GFR 86.43 08/21/2020 1631   GFRNONAA >60 05/10/2022 0601     Diabetic Labs (most recent): No results found for: "HGBA1C", "MICROALBUR"   Lipid Panel ( most recent) Lipid Panel  No results found for: "CHOL", "TRIG", "HDL", "CHOLHDL", "VLDL", "LDLCALC", "LDLDIRECT", "LABVLDL"    Lab Results  Component Value Date   TSH 0.834 09/15/2022   FREET4 1.04 09/15/2022           Assessment & Plan:   1) Type 2 diabetes mellitus with hyperglycemia, without long-term current use of insulin (HCC)  She presents today with no meter or logs to review.  Her most recent A1c on 7/19 was 8% but she notes she had been on antibiotics for UTI and then steroids for a reaction to the antibiotics prior to that.  She notes her PCP recently put her back on Ozempic after her thyroid nodule was determined not to be caused by the GLP1 med.  She notes she has significant stress, caring for 3 sons, has trouble sleeping through the night, and turns to food for comfort during those times.  She does note there is room for improvement in her dietary choices, does drink zero sugar pepsi and skips meals from time to time.  - Jenny Combs has currently uncontrolled symptomatic type 2 DM.  - I had a long discussion with her about the progressive nature of diabetes and the pathology behind its complications. -her diabetes is not currently complicated but she remains at a high risk for more acute and chronic complications which include CAD, CVA, CKD, retinopathy, and neuropathy. These are all discussed in detail with her.  The following Lifestyle Medicine recommendations according to American College of Lifestyle [employer] [place]) were discussed and offered to patient and she agrees to start the journey:  A. Whole Foods, Plant-based plate comprising of fruits and vegetables, plant-based proteins, whole-grain carbohydrates was discussed in detail with the patient.   A list for source of those nutrients were also provided  to the patient.  Patient will use only water or unsweetened tea for hydration. B.  The need to stay away from risky substances including alcohol, smoking; obtaining 7 to 9 hours of restorative sleep, at least 150 minutes of moderate intensity exercise weekly, the importance of healthy social connections,  and stress reduction techniques were discussed. C.  A full color page of  Calorie density of various food groups per pound showing examples of each food groups was provided to the patient.  - I have counseled her on diet and weight management by adopting a carbohydrate restricted/protein rich diet. Patient is encouraged to switch to unprocessed or minimally processed complex starch and increased protein intake (animal or plant source), fruits, and vegetables. -  she is advised to stick to a routine mealtimes to eat 3 meals a day and avoid unnecessary snacks (to snack only to correct hypoglycemia).   - she acknowledges that there is a room for improvement in her food and drink choices. - Suggestion is made for her to avoid simple carbohydrates from her diet including Cakes, Sweet Desserts, Ice Cream, Soda (diet and regular), Sweet Tea, Candies, Chips, Cookies, Store Bought Juices, Alcohol in Excess of 1-2 drinks a day, Artificial Sweeteners, Coffee Creamer, and "Sugar-free" Products. This will help patient to have more stable blood glucose profile and potentially avoid unintended weight gain.  - I have approached her with the following individualized plan to manage her diabetes and patient agrees:   -She is advised to continue Metformin 1000 mg ER daily after supper (to prevent GI SE), and increase to the full 2 mg dose of Ozempic weekly (currently listening for clicks to equal 1.75 mg).  -she is encouraged to start monitoring glucose 1 times daily, before breakfast, to log their readings on the clinic sheets provided, and bring them to review at follow up appointment in 3 months.  - Adjustment  parameters are given to her for hypo and hyperglycemia in writing. - she is encouraged to call clinic for blood glucose levels less than 70 or above 300 mg /dl.  -She did not tolerate Farxiga in the past- caused significant UTI.  - Specific targets for  A1c; LDL, HDL, and Triglycerides were discussed with the patient.  2) Blood Pressure /Hypertension:  her blood pressure is controlled to target without the use of antihypertensive medications.  3) Lipids/Hyperlipidemia:    There is no recent lipid panel available to review but she does note she does not tolerate statin medications.  We discussed lifestyle modifications to help reduce cholesterol to avoid needing meds in the future.   4)  Weight/Diet:  her Body mass index is 30.74 kg/m.  -  clearly complicating her diabetes care.   she is a candidate for weight loss. I discussed with her the fact that loss of 5 - 10% of her  current body weight will have the most impact on her diabetes management.  Exercise, and detailed carbohydrates information provided  -  detailed on discharge instructions.  5) Chronic Care/Health Maintenance: -she is not on ACEI/ARB or Statin medications (intolerant) and is encouraged to initiate and continue to follow up with Ophthalmology, Dentist, Podiatrist at least yearly or according to recommendations, and advised to stay away from smoking. I have recommended yearly flu vaccine and pneumonia vaccine at least every 5 years; moderate intensity exercise for up to 150 minutes weekly; and sleep for at least 7 hours a day.  6) Thyroid nodule She had thyroid biopsy on 2 nodules, 1 was sent to [facility] due to abnormal cells but determined to be benign, the other  was determined to be benign based on preliminary results.   She will need follow up in 1 year with repeat ultrasound to observe the other 1 mildly suspicious nodules.   Her thyroid blood tests were WNL, indicating she has a normal functioning thyroid gland.   Antibodies were negative, ruling out autoimmune thyroid dysfunction.   - she is advised to maintain close follow up with Elder Negus, PA-C for primary care needs, as well as her other providers for optimal and coordinated care.     I spent  30  minutes in the care of the patient today including review of labs from CMP, Lipids, Thyroid Function, Hematology (current and previous including abstractions from other facilities); face-to-face time discussing  her blood glucose readings/logs, discussing hypoglycemia and hyperglycemia episodes and symptoms, medications doses, her options of short and long term treatment based on the latest standards of care / guidelines;  discussion about incorporating lifestyle medicine;  and documenting the encounter. Risk reduction counseling performed per USPSTF guidelines to reduce obesity and cardiovascular risk factors.     Please refer to Patient Instructions for Blood Glucose Monitoring and Insulin/Medications Dosing Guide"  in media tab for additional information. Please  also refer to " Patient Self Inventory" in the Media  tab for reviewed elements of pertinent patient history.  Jenny Combs participated in the discussions, expressed understanding, and voiced agreement with the above plans.  All questions were answered to her satisfaction. she is encouraged to contact clinic should she have any questions or concerns prior to her return visit.     Follow up plan: - Return in about 3 months (around 05/08/2023) for Diabetes F/U with A1c in office, No previsit labs.    Ronny Bacon, [facility] [address] [place], [place] [address] Phone: [phone] Fax: [phone]  02/05/2023, 9:56 AM

## 2023-02-24 ENCOUNTER — Emergency Department (HOSPITAL_COMMUNITY)
Admission: EM | Admit: 2023-02-24 | Discharge: 2023-02-24 | Disposition: A | Discharge status: Home / Self Care | Payer: Medicare Other | Attending: Emergency Medicine | Admitting: Emergency Medicine

## 2023-02-24 ENCOUNTER — Emergency Department (HOSPITAL_COMMUNITY): Payer: Medicare Other

## 2023-02-24 ENCOUNTER — Other Ambulatory Visit: Payer: Self-pay

## 2023-02-24 ENCOUNTER — Encounter (HOSPITAL_COMMUNITY): Payer: Self-pay | Admitting: Emergency Medicine

## 2023-02-24 DIAGNOSIS — M546 Pain in thoracic spine: Secondary | ICD-10-CM | POA: Insufficient documentation

## 2023-02-24 DIAGNOSIS — M549 Dorsalgia, unspecified: Secondary | ICD-10-CM

## 2023-02-24 DIAGNOSIS — R42 Dizziness and giddiness: Secondary | ICD-10-CM | POA: Diagnosis not present

## 2023-02-24 DIAGNOSIS — R202 Paresthesia of skin: Secondary | ICD-10-CM | POA: Diagnosis not present

## 2023-02-24 DIAGNOSIS — R0789 Other chest pain: Secondary | ICD-10-CM | POA: Diagnosis not present

## 2023-02-24 DIAGNOSIS — F840 Autistic disorder: Secondary | ICD-10-CM | POA: Diagnosis not present

## 2023-02-24 DIAGNOSIS — R0602 Shortness of breath: Secondary | ICD-10-CM | POA: Diagnosis not present

## 2023-02-24 DIAGNOSIS — R739 Hyperglycemia, unspecified: Secondary | ICD-10-CM | POA: Diagnosis not present

## 2023-02-24 DIAGNOSIS — R079 Chest pain, unspecified: Secondary | ICD-10-CM | POA: Diagnosis present

## 2023-02-24 DIAGNOSIS — Z7982 Long term (current) use of aspirin: Secondary | ICD-10-CM | POA: Diagnosis not present

## 2023-02-24 LAB — CBC
HCT: 43.1 % (ref 36.0–46.0)
Hemoglobin: 13.7 g/dL (ref 12.0–15.0)
MCH: 29.1 pg (ref 26.0–34.0)
MCHC: 31.8 g/dL (ref 30.0–36.0)
MCV: 91.5 fL (ref 80.0–100.0)
Platelets: 236 10*3/uL (ref 150–400)
RBC: 4.71 MIL/uL (ref 3.87–5.11)
RDW: 13.3 % (ref 11.5–15.5)
WBC: 8.2 10*3/uL (ref 4.0–10.5)
nRBC: 0 % (ref 0.0–0.2)

## 2023-02-24 LAB — BASIC METABOLIC PANEL
Anion gap: 9 (ref 5–15)
BUN: 12 mg/dL (ref 6–20)
CO2: 25 mmol/L (ref 22–32)
Calcium: 9.4 mg/dL (ref 8.9–10.3)
Chloride: 103 mmol/L (ref 98–111)
Creatinine, Ser: 0.82 mg/dL (ref 0.44–1.00)
GFR, Estimated: >60 mL/min (ref >60)
Glucose, Bld: 175 mg/dL — ABNORMAL HIGH (ref 70–99)
Potassium: 3.5 mmol/L (ref 3.5–5.1)
Sodium: 137 mmol/L (ref 135–145)

## 2023-02-24 LAB — D-DIMER, QUANTITATIVE: D-Dimer, Quant: 0.32 ug{FEU}/mL (ref 0.00–0.50)

## 2023-02-24 LAB — TROPONIN I (HIGH SENSITIVITY)
Troponin I (High Sensitivity): <2 ng/L (ref <18)
Troponin I (High Sensitivity): <2 ng/L (ref <18)

## 2023-02-24 MED ORDER — LIDOCAINE 5 % EX PTCH: 1.0000 | MEDICATED_PATCH | CUTANEOUS | 0 refills | Status: AC

## 2023-02-24 MED ORDER — ACETAMINOPHEN 500 MG PO TABS
1000.0000 mg | ORAL_TABLET | Freq: Once | ORAL | Status: AC
  Administered 2023-02-24: 1000 mg via ORAL
  Filled 2023-02-24: qty 2

## 2023-02-24 MED ORDER — LIDOCAINE 5 % EX PTCH: 1.0000 | MEDICATED_PATCH | CUTANEOUS | Status: DC

## 2023-02-24 NOTE — ED Triage Notes (Signed)
Pt states last night just didn't feel well and then this morning had an aching in left side of chest, pain in back and numbness in left arm. Nausea without vomiting.

## 2023-02-24 NOTE — Discharge Instructions (Addendum)
Evaluation today was overall reassuring.  Suspect muscle injury.  Please treat conservatively at home.  Also sent Lidoderm patches to your pharmacy.  You can also use ibuprofen or naproxen for pain as well.  Recommend you also follow-up your PCP.  If your chest pain returns, you have new shortness of breath, calf tenderness, or any other concerning symptom please return emergency department further evaluation.

## 2023-02-24 NOTE — ED Provider Notes (Signed)
EMERGENCY DEPARTMENT AT [facility] Provider Note   CSN: [id number] Arrival date & time: 02/24/23  1301     History  Chief Complaint  Patient presents with   Chest Pain   Back Pain   HPI Jenny Combs is a 52 y.o. female with diabetes, fibromyalgia and bipolar 1 disorder presenting for chest pain and back pain.  Symptoms started last night.  She felt some pain in the left side of her chest radiates to her back she also felt some tingling in her left arm.  Symptoms seem to be worse this morning.  States she does have a grandson who is autistic that she cares for her and weighs 40 pounds and she picks them up a good bit.  She did pick him up last night and pain started hours later.  Endorses associated shortness of breath.  Unsure if symptoms are worse with exertion.  Symptoms are nonpleuritic.  States her left arm feels more like tingling like "I slept on it wrong".  States that the tingling has improved since has been here.  Denies headache, visual disturbance, and vertiginous symptoms.  Also reports history of a fasciotomy in her right leg in 2017.  She states that that leg can be intermittently swollen at times.  It always tender as well especially in the lower leg.   Chest Pain Associated symptoms: back pain   Back Pain Associated symptoms: chest pain        Home Medications Prior to Admission medications   Medication Sig Start Date End Date Taking? Authorizing Provider  lidocaine (LIDODERM) 5 % Place 1 patch onto the skin daily. Remove & Discard patch within 12 hours or as directed by MD 02/24/23  Yes Gareth Eagle, PA-C  ALPRAZolam Prudy Feeler) 0.5 MG tablet Take 0.5 mg by mouth 2 (two) times daily.    [provider]  Aspirin-Acetaminophen-Caffeine (GOODY HEADACHE PO) Take 1 each by mouth 3 (three) times daily as needed (headaches).    [provider]  benzonatate (TESSALON) 100 MG capsule Take 1 capsule (100 mg total) by mouth 3 (three)  times daily as needed for cough. Do not take with alcohol or while driving or operating heavy machinery.  May cause drowsiness. 11/04/22   Valentino Nose, NP  cyanocobalamin (VITAMIN B12) 1000 MCG/ML injection Inject into the muscle. 07/08/22   [provider]  cyclobenzaprine (FLEXERIL) 5 MG tablet Take by mouth. 02/24/22   [provider]  EPINEPHrine 0.3 mg/0.3 mL IJ SOAJ injection Inject 0.3 mg into the muscle as needed for anaphylaxis. 09/26/22   Theron Arista, PA-C  lidocaine (XYLOCAINE) 2 % solution Use as directed 15 mLs in the mouth or throat every 3 (three) hours as needed for mouth pain. Gargle and spit as needed for throat pain 11/04/22   Valentino Nose, NP  metFORMIN (GLUCOPHAGE-XR) 500 MG 24 hr tablet Take 2 tablets (1,000 mg total) by mouth daily. 02/05/23   Dani Gobble, NP  ondansetron (ZOFRAN) 4 MG tablet Take 1 tablet (4 mg total) by mouth every 6 (six) hours as needed for nausea. 09/19/18   Loren Racer, MD  ondansetron (ZOFRAN-ODT) 4 MG disintegrating tablet Take 1 tablet (4 mg total) by mouth every 8 (eight) hours as needed. 10/15/22   Leath-Warren, Sadie Haber, NP  oxyCODONE-acetaminophen (PERCOCET) 10-325 MG tablet Take 1 tablet by mouth every 4 (four) hours as needed for pain.    [provider]  traMADol (ULTRAM) 50 MG tablet  Take 50 mg by mouth every 6 (six) hours as needed for moderate pain.    [provider]      Allergies    Atorvastatin, Ibuprofen, Molds & smuts, and Statins    Review of Systems   Review of Systems  Cardiovascular:  Positive for chest pain.  Musculoskeletal:  Positive for back pain.    Physical Exam Updated Vital Signs BP 128/65 (BP Location: Right Arm)   Pulse 86   Temp 97.8 F (36.6 C) (Oral)   Resp 15   Ht 5\' 11"  (1.803 m)   Wt 94.8 kg   LMP 10/11/2018 (Exact Date)   SpO2 98%   BMI 29.15 kg/m  Physical Exam Vitals and nursing note reviewed.  HENT:     Head: Normocephalic and atraumatic.      Mouth/Throat:     Mouth: Mucous membranes are moist.  Eyes:     General:        Right eye: No discharge.        Left eye: No discharge.     Conjunctiva/sclera: Conjunctivae normal.  Cardiovascular:     Rate and Rhythm: Normal rate and regular rhythm.     Pulses: Normal pulses.     Heart sounds: Normal heart sounds.  Pulmonary:     Effort: Pulmonary effort is normal.     Breath sounds: Normal breath sounds.  Chest:       Comments: Left chest wall also tender with rotation of the left arm upward. Abdominal:     General: Abdomen is flat.     Palpations: Abdomen is soft.  Skin:    General: Skin is warm and dry.  Neurological:     General: No focal deficit present.  Psychiatric:        Mood and Affect: Mood normal.     ED Results / Procedures / Treatments   Labs (all labs ordered are listed, but only abnormal results are displayed) Labs Reviewed  BASIC METABOLIC PANEL - Abnormal; Notable for the following components:      Result Value   Glucose, Bld 175 (*)    All other components within normal limits  CBC  D-DIMER, QUANTITATIVE  TROPONIN I (HIGH SENSITIVITY)  TROPONIN I (HIGH SENSITIVITY)    EKG None  Radiology CT Head Wo Contrast  Result Date: 02/24/2023 CLINICAL DATA:  Left arm paresthesias, dizziness EXAM: CT HEAD WITHOUT CONTRAST TECHNIQUE: Contiguous axial images were obtained from the base of the skull through the vertex without intravenous contrast. RADIATION DOSE REDUCTION: This exam was performed according to the departmental dose-optimization program which includes automated exposure control, adjustment of the mA and/or kV according to patient size and/or use of iterative reconstruction technique. COMPARISON:  05/28/2004 FINDINGS: Brain: No evidence of acute infarction, hemorrhage, hydrocephalus, extra-axial collection or mass lesion/mass effect. Vascular: No hyperdense vessel or unexpected calcification. Skull: Normal. Negative for fracture or focal  lesion. Sinuses/Orbits: No acute finding. Other: None. IMPRESSION: No acute intracranial pathology. Electronically Signed   By: Judie Petit.  Shick M.D.   On: 02/24/2023 14:53   DG Chest 2 View  Result Date: 02/24/2023 CLINICAL DATA:  Acute chest pain EXAM: CHEST - 2 VIEW COMPARISON:  05/10/2022 FINDINGS: The heart size and mediastinal contours are within normal limits. Both lungs are clear. The visualized skeletal structures are unremarkable. IMPRESSION: No active cardiopulmonary disease. Electronically Signed   By: Judie Petit.  Shick M.D.   On: 02/24/2023 14:47    Procedures Procedures    Medications Ordered in ED Medications  lidocaine (LIDODERM) 5 % 1 patch (has no administration in time range)  acetaminophen (TYLENOL) tablet 1,000 mg (1,000 mg Oral Given 02/24/23 1546)    ED Course/ Medical Decision Making/ A&P             HEART Score: 2                    Medical Decision Making Amount and/or Complexity of Data Reviewed Labs: ordered. Radiology: ordered.  Risk OTC drugs.   Initial Impression and Ddx 52 year old well-appearing female presenting for chest pain and back pain.  Exam notable for reproducible left chest wall pain but otherwise unremarkable.  DDx includes ACS, PE, aortic dissection, stroke, pneumonia and pneumothorax, or MSK. Patient PMH that increases complexity of ED encounter: diabetes, fibromyalgia and bipolar 1 disorder   Interpretation of Diagnostics - I independent reviewed and interpreted the labs as followed: hyperglycemia  - I independently visualized the following imaging with scope of interpretation limited to determining acute life threatening conditions related to emergency care: CXR, CT head w/o with no acute findings  - I personally reviewed and interpreted EKG which revealed sinus rhythm  Patient Reassessment and Ultimate Disposition/Management Workup was overall reassuring and not suggestive of PE or ACS.  Also doubt dissection given symmetric pulses,  normotensive and chest pain improved with treatment of Tylenol.  Given chest wall tenderness and tenderness with movement of the left arm, suspect MSK etiology. On reassessment, patient did state that chest pain had resolved but back pain was still persistent. Treated with Tylenol and Lidoderm patch.  Advised conservative treatment for MSK pain at home and follow-up with PCP. Vitals stable.  Discussed pertinent return precautions.  Discharged home.  Patient management required discussion with the following services or consulting groups:  None  Complexity of Problems Addressed Acute complicated illness or Injury  Additional Data Reviewed and Analyzed Further history obtained from: Past medical history and medications listed in the EMR and Prior ED visit notes  Patient Encounter Risk Assessment Prescriptions         Final Clinical Impression(s) / ED Diagnoses Final diagnoses:  Atypical chest pain  Left-sided back pain, unspecified back location, unspecified chronicity    Rx / DC Orders ED Discharge Orders          Ordered    lidocaine (LIDODERM) 5 %  Every 24 hours        02/24/23 1632              Gareth Eagle, PA-C 02/24/23 1636    Rondel Baton, MD 02/25/23 [phone]

## 2023-02-24 NOTE — ED Notes (Signed)
Patient Alert and oriented to baseline. Stable and ambulatory to baseline. Patient verbalized understanding of the discharge instructions.  Patient belongings were taken by the patient.   

## 2023-05-13 ENCOUNTER — Ambulatory Visit: Payer: Medicare Other | Admitting: Nurse Practitioner

## 2023-07-03 ENCOUNTER — Ambulatory Visit
Admission: EM | Admit: 2023-07-03 | Discharge: 2023-07-03 | Disposition: A | Discharge status: Home / Self Care | Payer: Medicare Other | Attending: Nurse Practitioner | Admitting: Nurse Practitioner

## 2023-07-03 ENCOUNTER — Ambulatory Visit: Payer: Medicare Other | Admitting: Orthopedic Surgery

## 2023-07-03 ENCOUNTER — Encounter: Payer: Self-pay | Admitting: Emergency Medicine

## 2023-07-03 ENCOUNTER — Other Ambulatory Visit: Payer: Self-pay

## 2023-07-03 DIAGNOSIS — J209 Acute bronchitis, unspecified: Secondary | ICD-10-CM

## 2023-07-03 MED ORDER — PREDNISONE 20 MG PO TABS: 20.0000 mg | ORAL_TABLET | Freq: Every day | ORAL | 0 refills | Status: AC

## 2023-07-03 MED ORDER — PROMETHAZINE-DM 6.25-15 MG/5ML PO SYRP: 5.0000 mL | ORAL_SOLUTION | Freq: Four times a day (QID) | ORAL | 0 refills | Status: DC | PRN

## 2023-07-03 MED ORDER — ALBUTEROL SULFATE HFA 108 (90 BASE) MCG/ACT IN AERS: 2.0000 | INHALATION_SPRAY | Freq: Four times a day (QID) | RESPIRATORY_TRACT | 0 refills | Status: AC | PRN

## 2023-07-03 NOTE — Discharge Instructions (Addendum)
 Take medication as prescribed. Increase fluids and allow for plenty of rest. May take over-the-counter Tylenol  or ibuprofen as needed for pain, fever, or general discomfort. For your cough, recommend using a humidifier at nighttime during sleep and sleeping slightly elevated on pillows while symptoms persist. As discussed, your cough may continue to linger for another 1 to 2 weeks.  If you are generally feeling well, and continue to have a persistent cough, increase fluids and use over-the-counter cough medications and cough drops.  If you begin to have worsening symptoms to include wheezing, shortness of breath, difficulty breathing, or other concerns, follow-up immediately. Follow-up as needed.

## 2023-07-03 NOTE — ED Triage Notes (Addendum)
 Pt reports nasal congestion, productive cough x1 week. Pt reports was seen by pcp and started on z-pack. Pt reports cough continues, has taken otc medication.  Home covid test x2 with negative result.

## 2023-07-03 NOTE — ED Provider Notes (Signed)
 [facility]    CSN: [id number] Arrival date & time: 07/03/23  0802      History   Chief Complaint Chief Complaint  Patient presents with   Nasal Congestion    HPI Jenny Combs is a 53 y.o. female.   The history is provided by the patient.   Patient presents for complaints of cough that has been present for the past week.  Patient states she saw her doctor earlier in the week when symptoms started.  Patient states she was prescribed azithromycin, but continues to have a persistent cough.  Patient states cough worsens at night, and wakes her from sleep.  She compares her breathing to when someone is outside running in the cold air.  She denies new fever, chills, shortness of breath, wheezing, chest pain, abdominal pain, nausea, vomiting, diarrhea, or rash.  Patient with prior history of diabetes.  Most recent A1c was around 8.4 per patient.  Reports that she has taken 2 home COVID test, both were negative.  Past Medical History:  Diagnosis Date   Arthritis    Back pain    Bipolar 1 disorder (HCC)    Diabetes mellitus without complication (HCC)    Fibromyalgia    PTSD (post-traumatic stress disorder)     There are no active problems to display for this patient.   Past Surgical History:  Procedure Laterality Date   CESAREAN SECTION     COMPARTMENT SYNDROME MEASUREMENT      OB History   No obstetric history on file.      Home Medications    Prior to Admission medications   Medication Sig Start Date End Date Taking? Authorizing Provider  albuterol  (VENTOLIN  HFA) 108 (90 Base) MCG/ACT inhaler Inhale 2 puffs into the lungs every 6 (six) hours as needed. 07/03/23  Yes Leath-Warren, Etta PARAS, NP  predniSONE  (DELTASONE ) 20 MG tablet Take 1 tablet (20 mg total) by mouth daily with breakfast for 7 days. 07/03/23 07/10/23 Yes Leath-Warren, Etta PARAS, NP  promethazine -dextromethorphan (PROMETHAZINE -DM) 6.25-15 MG/5ML syrup Take 5 mLs by mouth 4 (four) times daily as  needed. 07/03/23  Yes Leath-Warren, Etta PARAS, NP  ALPRAZolam (XANAX) 0.5 MG tablet Take 0.5 mg by mouth 2 (two) times daily.    [provider]  Aspirin -Acetaminophen -Caffeine (GOODY HEADACHE PO) Take 1 each by mouth 3 (three) times daily as needed (headaches).    [provider]  benzonatate  (TESSALON ) 100 MG capsule Take 1 capsule (100 mg total) by mouth 3 (three) times daily as needed for cough. Do not take with alcohol or while driving or operating heavy machinery.  May cause drowsiness. 11/04/22   Chandra Harlene DELENA, NP  cyanocobalamin (VITAMIN B12) 1000 MCG/ML injection Inject into the muscle. 07/08/22   [provider]  cyclobenzaprine (FLEXERIL) 5 MG tablet Take by mouth. 02/24/22   [provider]  EPINEPHrine  0.3 mg/0.3 mL IJ SOAJ injection Inject 0.3 mg into the muscle as needed for anaphylaxis. 09/26/22   Emelia Sluder, PA-C  lidocaine  (LIDODERM ) 5 % Place 1 patch onto the skin daily. Remove & Discard patch within 12 hours or as directed by MD 02/24/23   Lang Norleen POUR, PA-C  lidocaine  (XYLOCAINE ) 2 % solution Use as directed 15 mLs in the mouth or throat every 3 (three) hours as needed for mouth pain. Gargle and spit as needed for throat pain 11/04/22   Chandra Harlene DELENA, NP  metFORMIN  (GLUCOPHAGE -XR) 500 MG 24 hr tablet Take 2 tablets (1,000 mg total) by  mouth daily. 02/05/23   Therisa Benton PARAS, NP  ondansetron  (ZOFRAN ) 4 MG tablet Take 1 tablet (4 mg total) by mouth every 6 (six) hours as needed for nausea. 09/19/18   Carlyle Lenis, MD  ondansetron  (ZOFRAN -ODT) 4 MG disintegrating tablet Take 1 tablet (4 mg total) by mouth every 8 (eight) hours as needed. 10/15/22   Leath-Warren, Etta PARAS, NP  oxyCODONE-acetaminophen  (PERCOCET) 10-325 MG tablet Take 1 tablet by mouth every 4 (four) hours as needed for pain.    [provider]  traMADol (ULTRAM) 50 MG tablet Take 50 mg by mouth every 6 (six) hours as needed for moderate pain.    [provider]    Family History Family History  Problem Relation Age of Onset   Diabetes Mother    Cancer Mother    Hypertension Mother    Diabetes Father    Cancer Father    Hypertension Father     Social History Social History   Tobacco Use   Smoking status: Never   Smokeless tobacco: Never  Vaping Use   Vaping status: Never Used  Substance Use Topics   Alcohol use: No   Drug use: Never     Allergies   Atorvastatin, Ibuprofen, Molds & smuts, and Statins   Review of Systems Review of Systems Per HPI  Physical Exam Triage Vital Signs ED Triage Vitals  Encounter Vitals Group     BP 07/03/23 0819 116/73     Systolic BP Percentile --      Diastolic BP Percentile --      Pulse Rate 07/03/23 0819 87     Resp 07/03/23 0819 20     Temp 07/03/23 0819 99 F (37.2 C)     Temp Source 07/03/23 0819 Oral     SpO2 07/03/23 0819 97 %     Weight --      Height --      Head Circumference --      Peak Flow --      Pain Score 07/03/23 0818 7     Pain Loc --      Pain Education --      Exclude from Growth Chart --    No data found.  Updated Vital Signs BP 116/73 (BP Location: Right Arm)   Pulse 87   Temp 99 F (37.2 C) (Oral)   Resp 20   LMP 10/11/2018 (Exact Date)   SpO2 97%   Visual Acuity Right Eye Distance:   Left Eye Distance:   Bilateral Distance:    Right Eye Near:   Left Eye Near:    Bilateral Near:     Physical Exam Vitals and nursing note reviewed.  Constitutional:      General: She is not in acute distress.    Appearance: Normal appearance.  HENT:     Head: Normocephalic.     Right Ear: Tympanic membrane, ear canal and external ear normal.     Left Ear: Tympanic membrane, ear canal and external ear normal.     Nose: Congestion present.     Right Turbinates: Enlarged and swollen.     Left Turbinates: Enlarged and swollen.     Right Sinus: No maxillary sinus tenderness or frontal sinus tenderness.     Left Sinus: No maxillary sinus  tenderness or frontal sinus tenderness.     Mouth/Throat:     Lips: Pink.     Mouth: Mucous membranes are moist.     Pharynx: Uvula midline. Postnasal  drip present. No pharyngeal swelling or posterior oropharyngeal erythema.     Comments: Cobblestoning present to posterior oropharynx  Eyes:     Extraocular Movements: Extraocular movements intact.     Conjunctiva/sclera: Conjunctivae normal.     Pupils: Pupils are equal, round, and reactive to light.  Cardiovascular:     Rate and Rhythm: Normal rate and regular rhythm.     Pulses: Normal pulses.     Heart sounds: Normal heart sounds.  Pulmonary:     Effort: Pulmonary effort is normal. No respiratory distress.     Breath sounds: Normal breath sounds. No stridor. No wheezing, rhonchi or rales.  Abdominal:     General: Bowel sounds are normal.     Palpations: Abdomen is soft.     Tenderness: There is no abdominal tenderness.  Musculoskeletal:     Cervical back: Normal range of motion.  Lymphadenopathy:     Cervical: No cervical adenopathy.  Skin:    General: Skin is warm and dry.  Neurological:     General: No focal deficit present.     Mental Status: She is alert and oriented to person, place, and time.  Psychiatric:        Mood and Affect: Mood normal.        Behavior: Behavior normal.      UC Treatments / Results  Labs (all labs ordered are listed, but only abnormal results are displayed) Labs Reviewed - No data to display  EKG   Radiology No results found.  Procedures Procedures (including critical care time)  Medications Ordered in UC Medications - No data to display  Initial Impression / Assessment and Plan / UC Course  I have reviewed the triage vital signs and the nursing notes.  Pertinent labs & imaging results that were available during my care of the patient were reviewed by me and considered in my medical decision making (see chart for details).  Patient presents with persistent cough that is been  present for 1 week.  She has completed azithromycin, but continues to have persistent cough.  Suspect patient has developed bronchitis.  Will provide symptomatic treatment with prednisone  20 mg (low-dose based on report of most recent A1c), Promethazine  DM for the cough, and an albuterol  inhaler as needed for shortness of breath, wheezing, or other concerns.  Supportive care recommendations were provided and discussed with the patient to include fluids, rest, and use of a humidifier at nighttime during sleep.  Discussed indications with the patient regarding when follow-up be indicated.  Patient was in agreement with this plan of care and verbalizes understanding.  All questions were answered.  Patient stable for discharge.   Final Clinical Impressions(s) / UC Diagnoses   Final diagnoses:  Acute bronchitis, unspecified organism     Discharge Instructions      Take medication as prescribed. Increase fluids and allow for plenty of rest. May take over-the-counter Tylenol  or ibuprofen as needed for pain, fever, or general discomfort. For your cough, recommend using a humidifier at nighttime during sleep and sleeping slightly elevated on pillows while symptoms persist. As discussed, your cough may continue to linger for another 1 to 2 weeks.  If you are generally feeling well, and continue to have a persistent cough, increase fluids and use over-the-counter cough medications and cough drops.  If you begin to have worsening symptoms to include wheezing, shortness of breath, difficulty breathing, or other concerns, follow-up immediately. Follow-up as needed.     ED Prescriptions  Medication Sig Dispense Auth. Provider   predniSONE  (DELTASONE ) 20 MG tablet Take 1 tablet (20 mg total) by mouth daily with breakfast for 7 days. 7 tablet Leath-Warren, Etta PARAS, NP   albuterol  (VENTOLIN  HFA) 108 (90 Base) MCG/ACT inhaler Inhale 2 puffs into the lungs every 6 (six) hours as needed. 8 g  Leath-Warren, Etta PARAS, NP   promethazine -dextromethorphan (PROMETHAZINE -DM) 6.25-15 MG/5ML syrup Take 5 mLs by mouth 4 (four) times daily as needed. 118 mL Leath-Warren, Etta PARAS, NP      PDMP not reviewed this encounter.   Gilmer Etta PARAS, NP 07/03/23 [phone]

## 2023-07-15 ENCOUNTER — Ambulatory Visit: Payer: Medicare Other | Admitting: Orthopedic Surgery

## 2023-07-23 ENCOUNTER — Ambulatory Visit: Payer: Medicare Other | Admitting: Nurse Practitioner

## 2023-09-25 ENCOUNTER — Other Ambulatory Visit: Payer: Self-pay

## 2023-09-25 ENCOUNTER — Ambulatory Visit
Admission: RE | Admit: 2023-09-25 | Discharge: 2023-09-25 | Disposition: A | Discharge status: Home / Self Care | Source: Ambulatory Visit

## 2023-09-25 VITALS — BP 141/78 | HR 106 | Temp 98.0°F | Resp 20

## 2023-09-25 DIAGNOSIS — U071 COVID-19: Secondary | ICD-10-CM | POA: Diagnosis not present

## 2023-09-25 LAB — POC COVID19/FLU A&B COMBO
Covid Antigen, POC: POSITIVE — AB
Influenza A Antigen, POC: NEGATIVE
Influenza B Antigen, POC: NEGATIVE

## 2023-09-25 MED ORDER — PAXLOVID (300/100) 20 X 150 MG & 10 X 100MG PO TBPK: 3.0000 | ORAL_TABLET | Freq: Two times a day (BID) | ORAL | 0 refills | Status: AC

## 2023-09-25 MED ORDER — PROMETHAZINE-DM 6.25-15 MG/5ML PO SYRP: 5.0000 mL | ORAL_SOLUTION | Freq: Four times a day (QID) | ORAL | 0 refills | Status: DC | PRN

## 2023-09-25 NOTE — Discharge Instructions (Signed)
 The COVID/flu test was positive for COVID. Take medication as prescribed. Increase fluids and allow for plenty of rest. May take over-the-counter Tylenol as needed for pain, fever, general discomfort. Recommend normal saline nasal spray throughout the day for nasal congestion and runny nose. If you develop a cough, it will also be helpful to use a humidifier and sleep elevated on pillows while symptoms persist. As discussed, if you experience worsening symptoms to include high fever, chills, wheezing, difficulty breathing, shortness of breath, or other concerns, please go to the emergency department immediately for further evaluation. If you develop a fever, you should remain home until you have been fever free with no medication for at least 24 hours. You should wear your mask while you are experiencing symptoms and while taking the medication.  If you continue to experience symptoms after completing the medication, continue to wear your mask for an additional 5 days. You should remain isolated if you have a fever.  You should also remain home until you have been fever free for 24 hours with no medication. Follow-up as needed. Please notify your primary care physician of your recent positive COVID test. Follow-up as needed.

## 2023-09-25 NOTE — ED Provider Notes (Signed)
 [facility]    CSN: [id number] Arrival date & time: 09/25/23  1651      History   Chief Complaint Chief Complaint  Patient presents with   Chills    Fever, cough, body aches - Entered by patient    HPI Jenny Combs is a 53 y.o. female.   The history is provided by the patient.   Patient with 2-day history of bodyaches, chills, headache, fatigue, decreased appetite, nausea, and cough.  Patient denies ear pain, ear drainage, wheezing, difficulty breathing, chest pain, abdominal pain, nausea, vomiting, diarrhea, or rash.  Patient reports she has been taking over-the-counter cough and cold medications.  States that her grandson was diagnosed with influenza over the past week.  Past Medical History:  Diagnosis Date   Arthritis    Back pain    Bipolar 1 disorder (HCC)    Diabetes mellitus without complication (HCC)    Fibromyalgia    PTSD (post-traumatic stress disorder)     There are no active problems to display for this patient.   Past Surgical History:  Procedure Laterality Date   CESAREAN SECTION     COMPARTMENT SYNDROME MEASUREMENT      OB History   No obstetric history on file.      Home Medications    Prior to Admission medications   Medication Sig Start Date End Date Taking? Authorizing Provider  nirmatrelvir/ritonavir (PAXLOVID, 300/100,) 20 x 150 MG & 10 x 100MG  TBPK Take 3 tablets by mouth 2 (two) times daily for 5 days. Patient GFR is > 60.  Take nirmatrelvir (150 mg) two tablets twice daily for 5 days and ritonavir (100 mg) one tablet twice daily for 5 days. 09/25/23 09/30/23 Yes Leath-Warren, Sadie Haber, NP  promethazine-dextromethorphan (PROMETHAZINE-DM) 6.25-15 MG/5ML syrup Take 5 mLs by mouth 4 (four) times daily as needed. 09/25/23  Yes Leath-Warren, Sadie Haber, NP  Semaglutide, 2 MG/DOSE, (OZEMPIC, 2 MG/DOSE,) 8 MG/3ML SOPN 2 mg Subcutaneous weekly for 30 days 11/14/22  Yes [provider]  albuterol (VENTOLIN HFA) 108 (90 Base)  MCG/ACT inhaler Inhale 2 puffs into the lungs every 6 (six) hours as needed. 07/03/23   Leath-Warren, Sadie Haber, NP  ALPRAZolam Prudy Feeler) 0.5 MG tablet Take 0.5 mg by mouth 2 (two) times daily.    [provider]  Aspirin-Acetaminophen-Caffeine (GOODY HEADACHE PO) Take 1 each by mouth 3 (three) times daily as needed (headaches).    [provider]  benzonatate (TESSALON) 100 MG capsule Take 1 capsule (100 mg total) by mouth 3 (three) times daily as needed for cough. Do not take with alcohol or while driving or operating heavy machinery.  May cause drowsiness. 11/04/22   Valentino Nose, NP  cyanocobalamin (VITAMIN B12) 1000 MCG/ML injection Inject into the muscle. 07/08/22   [provider]  cyclobenzaprine (FLEXERIL) 5 MG tablet Take by mouth. 02/24/22   [provider]  EPINEPHrine 0.3 mg/0.3 mL IJ SOAJ injection Inject 0.3 mg into the muscle as needed for anaphylaxis. 09/26/22   Theron Arista, PA-C  lidocaine (LIDODERM) 5 % Place 1 patch onto the skin daily. Remove & Discard patch within 12 hours or as directed by MD 02/24/23   Gareth Eagle, PA-C  lidocaine (XYLOCAINE) 2 % solution Use as directed 15 mLs in the mouth or throat every 3 (three) hours as needed for mouth pain. Gargle and spit as needed for throat pain 11/04/22   Cathlean Marseilles A, NP  metFORMIN (GLUCOPHAGE-XR) 500 MG 24 hr tablet  Take 2 tablets (1,000 mg total) by mouth daily. 02/05/23   Dani Gobble, NP  ondansetron (ZOFRAN) 4 MG tablet Take 1 tablet (4 mg total) by mouth every 6 (six) hours as needed for nausea. 09/19/18   Loren Racer, MD  ondansetron (ZOFRAN-ODT) 4 MG disintegrating tablet Take 1 tablet (4 mg total) by mouth every 8 (eight) hours as needed. 10/15/22   Leath-Warren, Sadie Haber, NP  oxyCODONE-acetaminophen (PERCOCET) 10-325 MG tablet Take 1 tablet by mouth every 4 (four) hours as needed for pain.    [provider]  traMADol (ULTRAM) 50 MG tablet Take 50 mg by mouth every  6 (six) hours as needed for moderate pain.    [provider]    Family History Family History  Problem Relation Age of Onset   Diabetes Mother    Cancer Mother    Hypertension Mother    Diabetes Father    Cancer Father    Hypertension Father     Social History Social History   Tobacco Use   Smoking status: Never   Smokeless tobacco: Never  Vaping Use   Vaping status: Never Used  Substance Use Topics   Alcohol use: No   Drug use: Never     Allergies   Atorvastatin, Ibuprofen, Molds & smuts, and Statins   Review of Systems Review of Systems Per HPI  Physical Exam Triage Vital Signs ED Triage Vitals [09/25/23 1740]  Encounter Vitals Group     BP (!) 141/78     Systolic BP Percentile      Diastolic BP Percentile      Pulse Rate (!) 106     Resp 20     Temp 98 F (36.7 C)     Temp Source Oral     SpO2 94 %     Weight      Height      Head Circumference      Peak Flow      Pain Score 5     Pain Loc      Pain Education      Exclude from Growth Chart    No data found.  Updated Vital Signs BP (!) 141/78 (BP Location: Right Arm)   Pulse (!) 106   Temp 98 F (36.7 C) (Oral)   Resp 20   LMP 10/11/2018 (Exact Date)   SpO2 94%   Visual Acuity Right Eye Distance:   Left Eye Distance:   Bilateral Distance:    Right Eye Near:   Left Eye Near:    Bilateral Near:     Physical Exam Vitals and nursing note reviewed.  Constitutional:      General: She is not in acute distress.    Appearance: Normal appearance.  HENT:     Head: Normocephalic.     Right Ear: Tympanic membrane, ear canal and external ear normal.     Left Ear: Tympanic membrane, ear canal and external ear normal.     Nose: Congestion present.     Right Turbinates: Enlarged and swollen.     Left Turbinates: Enlarged and swollen.     Right Sinus: No maxillary sinus tenderness or frontal sinus tenderness.     Left Sinus: No maxillary sinus tenderness or frontal sinus tenderness.      Mouth/Throat:     Lips: Pink.     Mouth: Mucous membranes are moist.     Pharynx: Uvula midline. Posterior oropharyngeal erythema and postnasal drip present. No pharyngeal swelling, oropharyngeal  exudate or uvula swelling.  Eyes:     Extraocular Movements: Extraocular movements intact.     Conjunctiva/sclera: Conjunctivae normal.     Pupils: Pupils are equal, round, and reactive to light.  Cardiovascular:     Rate and Rhythm: Normal rate and regular rhythm.     Pulses: Normal pulses.     Heart sounds: Normal heart sounds.  Pulmonary:     Effort: Pulmonary effort is normal. No respiratory distress.     Breath sounds: Normal breath sounds. No stridor. No wheezing, rhonchi or rales.  Abdominal:     General: Bowel sounds are normal.     Palpations: Abdomen is soft.     Tenderness: There is no abdominal tenderness.  Musculoskeletal:     Cervical back: Normal range of motion.  Lymphadenopathy:     Cervical: No cervical adenopathy.  Skin:    General: Skin is warm and dry.  Neurological:     General: No focal deficit present.     Mental Status: She is alert and oriented to person, place, and time.  Psychiatric:        Mood and Affect: Mood normal.        Behavior: Behavior normal.      UC Treatments / Results  Labs (all labs ordered are listed, but only abnormal results are displayed) Labs Reviewed  POC COVID19/FLU A&B COMBO - Abnormal; Notable for the following components:      Result Value   Covid Antigen, POC Positive (*)    All other components within normal limits    EKG   Radiology No results found.  Procedures Procedures (including critical care time)  Medications Ordered in UC Medications - No data to display  Initial Impression / Assessment and Plan / UC Course  I have reviewed the triage vital signs and the nursing notes.  Pertinent labs & imaging results that were available during my care of the patient were reviewed by me and considered in my  medical decision making (see chart for details).  COVID/flu test was positive for COVID.  Start patient on Paxlovid for positive COVID test, and Promethazine DM for the cough.  Patient advised she has fluticasone at home, advised patient to take medication as prescribed.  Supportive care recommendations were provided and discussed with the patient to include fluids, rest, over-the-counter analgesics, and use of a humidifier during sleep.  Discussed current CDC isolation precautions.  Patient was given strict ER follow-up precautions.  Patient was in agreement with this plan of care and verbalized understanding.  All questions were answered.  Patient stable for discharge.  Final Clinical Impressions(s) / UC Diagnoses   Final diagnoses:  COVID     Discharge Instructions      The COVID/flu test was positive for COVID. Take medication as prescribed. Increase fluids and allow for plenty of rest. May take over-the-counter Tylenol as needed for pain, fever, general discomfort. Recommend normal saline nasal spray throughout the day for nasal congestion and runny nose. If you develop a cough, it will also be helpful to use a humidifier and sleep elevated on pillows while symptoms persist. As discussed, if you experience worsening symptoms to include high fever, chills, wheezing, difficulty breathing, shortness of breath, or other concerns, please go to the emergency department immediately for further evaluation. If you develop a fever, you should remain home until you have been fever free with no medication for at least 24 hours. You should wear your mask while you are experiencing symptoms  and while taking the medication.  If you continue to experience symptoms after completing the medication, continue to wear your mask for an additional 5 days. You should remain isolated if you have a fever.  You should also remain home until you have been fever free for 24 hours with no medication. Follow-up as  needed. Please notify your primary care physician of your recent positive COVID test. Follow-up as needed.     ED Prescriptions     Medication Sig Dispense Auth. Provider   nirmatrelvir/ritonavir (PAXLOVID, 300/100,) 20 x 150 MG & 10 x 100MG  TBPK Take 3 tablets by mouth 2 (two) times daily for 5 days. Patient GFR is > 60.  Take nirmatrelvir (150 mg) two tablets twice daily for 5 days and ritonavir (100 mg) one tablet twice daily for 5 days. 30 tablet Leath-Warren, Sadie Haber, NP   promethazine-dextromethorphan (PROMETHAZINE-DM) 6.25-15 MG/5ML syrup Take 5 mLs by mouth 4 (four) times daily as needed. 118 mL Leath-Warren, Sadie Haber, NP      PDMP not reviewed this encounter.   Abran Cantor, NP 09/25/23 [phone]

## 2023-09-25 NOTE — ED Triage Notes (Signed)
 Pt reports was exposed to flu. Pt reports body aches, chills, headache, fatigue x2 days. Decreased appetite, nausea.

## 2023-09-29 ENCOUNTER — Ambulatory Visit: Payer: Medicare Other | Admitting: Nurse Practitioner

## 2023-09-29 DIAGNOSIS — E1165 Type 2 diabetes mellitus with hyperglycemia: Secondary | ICD-10-CM

## 2023-09-29 DIAGNOSIS — Z7984 Long term (current) use of oral hypoglycemic drugs: Secondary | ICD-10-CM

## 2023-09-29 DIAGNOSIS — E042 Nontoxic multinodular goiter: Secondary | ICD-10-CM

## 2023-10-23 ENCOUNTER — Ambulatory Visit: Admitting: Nurse Practitioner

## 2024-01-13 ENCOUNTER — Ambulatory Visit: Admitting: Nurse Practitioner

## 2024-04-02 ENCOUNTER — Other Ambulatory Visit: Payer: Self-pay | Admitting: Nurse Practitioner

## 2024-04-12 ENCOUNTER — Ambulatory Visit: Admitting: Nurse Practitioner

## 2024-05-12 ENCOUNTER — Ambulatory Visit
Admission: EM | Admit: 2024-05-12 | Discharge: 2024-05-12 | Disposition: A | Discharge status: Home / Self Care | Attending: Nurse Practitioner | Admitting: Nurse Practitioner

## 2024-05-12 DIAGNOSIS — B9689 Other specified bacterial agents as the cause of diseases classified elsewhere: Secondary | ICD-10-CM

## 2024-05-12 DIAGNOSIS — R21 Rash and other nonspecific skin eruption: Secondary | ICD-10-CM | POA: Diagnosis not present

## 2024-05-12 DIAGNOSIS — J019 Acute sinusitis, unspecified: Secondary | ICD-10-CM

## 2024-05-12 MED ORDER — CETIRIZINE HCL 10 MG PO TABS
10.0000 mg | ORAL_TABLET | Freq: Every day | ORAL | Status: DC
  Administered 2024-05-12: 10 mg via ORAL

## 2024-05-12 MED ORDER — AMOXICILLIN-POT CLAVULANATE 875-125 MG PO TABS: 1.0000 | ORAL_TABLET | Freq: Two times a day (BID) | ORAL | 0 refills | Status: AC

## 2024-05-12 NOTE — ED Triage Notes (Addendum)
 Pt reports itchy watery eyes tingling in the scalp and skin and a rash feels like throat is one fire and it is tight. Started while at work today. X 1.5 hours

## 2024-05-12 NOTE — Discharge Instructions (Signed)
 We gave you cetirizine 10 mg today to help with the rash and possible allergic reaction.  You can continue this up to twice daily to help with itching as needed.   For the sinus infection, take the Augmentin twice daily for 7 days as prescribed to treat it.  Also recommend hydration with plenty of fluids and guaifenesin  1200 mg twice daily to help loosen the congestion.  Nasal saline spray and neti pot rinses can also be helpful.

## 2024-05-12 NOTE — ED Provider Notes (Signed)
 [facility]    CSN: [id number] Arrival date & time: 05/12/24  1247      History   Chief Complaint No chief complaint on file.   HPI Jenny Combs is a 53 y.o. female.   Patient presents today for itchy rash that began suddenly while on her way to work today.  She reports there are bumps on her chest and around her eyes.  Reports her eyes also feel swollen, are itchy, and watery.  She has some tingling in her lips additionally.  Reports when symptoms began, she felt a little panicky and felt a little bit short of breath but this has improved.  No chest pain or throat/tongue swelling.  She also reports he has been down to the sinus infection for the past 4+ days.  Reports her throat feels like it is on fire.  Has taken over-the-counter medicine without much improvement.    Past Medical History:  Diagnosis Date   Arthritis    Back pain    Bipolar 1 disorder (HCC)    Diabetes mellitus without complication (HCC)    Fibromyalgia    PTSD (post-traumatic stress disorder)     There are no active problems to display for this patient.   Past Surgical History:  Procedure Laterality Date   CESAREAN SECTION     COMPARTMENT SYNDROME MEASUREMENT      OB History   No obstetric history on file.      Home Medications    Prior to Admission medications   Medication Sig Start Date End Date Taking? Authorizing Provider  amoxicillin-clavulanate (AUGMENTIN) 875-125 MG tablet Take 1 tablet by mouth 2 (two) times daily for 7 days. 05/12/24 05/19/24 Yes Chandra Harlene DELENA, NP  albuterol  (VENTOLIN  HFA) 108 (90 Base) MCG/ACT inhaler Inhale 2 puffs into the lungs every 6 (six) hours as needed. 07/03/23   Leath-Warren, Etta PARAS, NP  ALPRAZolam (XANAX) 0.5 MG tablet Take 0.5 mg by mouth 2 (two) times daily.    [provider]  Aspirin -Acetaminophen -Caffeine (GOODY HEADACHE PO) Take 1 each by mouth 3 (three) times daily as needed (headaches).    [provider]   benzonatate  (TESSALON ) 100 MG capsule Take 1 capsule (100 mg total) by mouth 3 (three) times daily as needed for cough. Do not take with alcohol or while driving or operating heavy machinery.  May cause drowsiness. 11/04/22   Chandra Harlene DELENA, NP  cyanocobalamin (VITAMIN B12) 1000 MCG/ML injection Inject into the muscle. 07/08/22   [provider]  cyclobenzaprine (FLEXERIL) 5 MG tablet Take by mouth. 02/24/22   [provider]  EPINEPHrine  0.3 mg/0.3 mL IJ SOAJ injection Inject 0.3 mg into the muscle as needed for anaphylaxis. 09/26/22   Emelia Sluder, PA-C  lidocaine  (LIDODERM ) 5 % Place 1 patch onto the skin daily. Remove & Discard patch within 12 hours or as directed by MD 02/24/23   Lang Norleen POUR, PA-C  lidocaine  (XYLOCAINE ) 2 % solution Use as directed 15 mLs in the mouth or throat every 3 (three) hours as needed for mouth pain. Gargle and spit as needed for throat pain 11/04/22   Chandra Harlene DELENA, NP  metFORMIN  (GLUCOPHAGE -XR) 500 MG 24 hr tablet TAKE 2 TABLETS BY MOUTH EVERY DAY 04/05/24   Therisa Benton PARAS, NP  ondansetron  (ZOFRAN ) 4 MG tablet Take 1 tablet (4 mg total) by mouth every 6 (six) hours as needed for nausea. 09/19/18   Carlyle Lenis, MD  ondansetron  (ZOFRAN -ODT) 4 MG disintegrating tablet Take  1 tablet (4 mg total) by mouth every 8 (eight) hours as needed. 10/15/22   Leath-Warren, Etta PARAS, NP  oxyCODONE-acetaminophen  (PERCOCET) 10-325 MG tablet Take 1 tablet by mouth every 4 (four) hours as needed for pain.    [provider]  promethazine -dextromethorphan (PROMETHAZINE -DM) 6.25-15 MG/5ML syrup Take 5 mLs by mouth 4 (four) times daily as needed. 09/25/23   Leath-Warren, Etta PARAS, NP  Semaglutide, 2 MG/DOSE, (OZEMPIC, 2 MG/DOSE,) 8 MG/3ML SOPN 2 mg Subcutaneous weekly for 30 days 11/14/22   [provider]  traMADol (ULTRAM) 50 MG tablet Take 50 mg by mouth every 6 (six) hours as needed for moderate pain.    [provider]    Family  History Family History  Problem Relation Age of Onset   Diabetes Mother    Cancer Mother    Hypertension Mother    Diabetes Father    Cancer Father    Hypertension Father     Social History Social History   Tobacco Use   Smoking status: Never   Smokeless tobacco: Never  Vaping Use   Vaping status: Never Used  Substance Use Topics   Alcohol use: No   Drug use: Never     Allergies   Atorvastatin, Ibuprofen, Molds & smuts, and Statins   Review of Systems Review of Systems Per HPI  Physical Exam Triage Vital Signs ED Triage Vitals [05/12/24 1256]  Encounter Vitals Group     BP 133/82     Girls Systolic BP Percentile      Girls Diastolic BP Percentile      Boys Systolic BP Percentile      Boys Diastolic BP Percentile      Pulse Rate 87     Resp 20     Temp 98.3 F (36.8 C)     Temp src      SpO2 98 %     Weight      Height      Head Circumference      Peak Flow      Pain Score 0     Pain Loc      Pain Education      Exclude from Growth Chart    No data found.  Updated Vital Signs BP 133/82 (BP Location: Right Arm)   Pulse 87   Temp 98.3 F (36.8 C)   Resp 20   LMP 10/11/2018 (Exact Date)   SpO2 98%   Visual Acuity Right Eye Distance:   Left Eye Distance:   Bilateral Distance:    Right Eye Near:   Left Eye Near:    Bilateral Near:     Physical Exam Vitals and nursing note reviewed.  Constitutional:      General: She is not in acute distress.    Appearance: Normal appearance. She is not ill-appearing or toxic-appearing.  HENT:     Head: Normocephalic and atraumatic.     Right Ear: Tympanic membrane, ear canal and external ear normal.     Left Ear: Tympanic membrane, ear canal and external ear normal.     Nose: No congestion or rhinorrhea.     Right Sinus: Maxillary sinus tenderness present. No frontal sinus tenderness.     Left Sinus: Maxillary sinus tenderness present. No frontal sinus tenderness.     Mouth/Throat:     Mouth: Mucous  membranes are moist.     Pharynx: Oropharynx is clear. Postnasal drip present. No oropharyngeal exudate.     Comments: Posterior oropharynx is  patent Eyes:     General: No scleral icterus.    Extraocular Movements: Extraocular movements intact.  Cardiovascular:     Rate and Rhythm: Normal rate and regular rhythm.  Pulmonary:     Effort: Pulmonary effort is normal. No respiratory distress.     Breath sounds: Normal breath sounds. No wheezing, rhonchi or rales.  Musculoskeletal:     Cervical back: Normal range of motion and neck supple.  Lymphadenopathy:     Cervical: No cervical adenopathy.  Skin:    General: Skin is warm and dry.     Capillary Refill: Capillary refill takes less than 2 seconds.     Coloration: Skin is not jaundiced or pale.     Findings: Erythema and rash present.     Comments: Slightly erythematous, papular rash noted to forehead, chest.  Neurological:     Mental Status: She is alert and oriented to person, place, and time.     Motor: No weakness.  Psychiatric:        Behavior: Behavior is cooperative.      UC Treatments / Results  Labs (all labs ordered are listed, but only abnormal results are displayed) Labs Reviewed - No data to display  EKG   Radiology No results found.  Procedures Procedures (including critical care time)  Medications Ordered in UC Medications  cetirizine (ZYRTEC) tablet 10 mg (10 mg Oral Given 05/12/24 1328)    Initial Impression / Assessment and Plan / UC Course  I have reviewed the triage vital signs and the nursing notes.  Pertinent labs & imaging results that were available during my care of the patient were reviewed by me and considered in my medical decision making (see chart for details).   In triage, patient is well-appearing and vital signs are within normal limits.  On exam, patient has papular, slightly erythematous rash noted to chest and above eyes.  10 mg cetirizine was given with improvement in the itching.   Additionally, she appears to have a sinus infection which we will treat with Augmentin twice daily for 7 days.  Supportive care was discussed with the patient including continuing oral antihistamine as long as rash persists and avoidance of triggers.  Return and ER precautions discussed with patient.  The patient was given the opportunity to ask questions.  All questions answered to their satisfaction.  The patient is in agreement to this plan.   Final Clinical Impressions(s) / UC Diagnoses   Final diagnoses:  Rash and nonspecific skin eruption  Acute bacterial sinusitis     Discharge Instructions      We gave you cetirizine 10 mg today to help with the rash and possible allergic reaction.  You can continue this up to twice daily to help with itching as needed.   For the sinus infection, take the Augmentin twice daily for 7 days as prescribed to treat it.  Also recommend hydration with plenty of fluids and guaifenesin  1200 mg twice daily to help loosen the congestion.  Nasal saline spray and neti pot rinses can also be helpful.    ED Prescriptions     Medication Sig Dispense Auth. Provider   amoxicillin-clavulanate (AUGMENTIN) 875-125 MG tablet Take 1 tablet by mouth 2 (two) times daily for 7 days. 14 tablet Chandra Harlene LABOR, NP      PDMP not reviewed this encounter.   Chandra Harlene LABOR, NP 05/12/24 [phone]

## 2024-06-10 ENCOUNTER — Ambulatory Visit: Admitting: Nurse Practitioner

## 2024-07-11 ENCOUNTER — Emergency Department (HOSPITAL_COMMUNITY)

## 2024-07-11 ENCOUNTER — Other Ambulatory Visit: Payer: Self-pay

## 2024-07-11 ENCOUNTER — Emergency Department (HOSPITAL_COMMUNITY)
Admission: EM | Admit: 2024-07-11 | Discharge: 2024-07-11 | Disposition: A | Discharge status: Home / Self Care | Attending: Emergency Medicine | Admitting: Emergency Medicine

## 2024-07-11 ENCOUNTER — Encounter (HOSPITAL_COMMUNITY): Payer: Self-pay | Admitting: Emergency Medicine

## 2024-07-11 DIAGNOSIS — R202 Paresthesia of skin: Secondary | ICD-10-CM | POA: Diagnosis not present

## 2024-07-11 DIAGNOSIS — R519 Headache, unspecified: Secondary | ICD-10-CM | POA: Insufficient documentation

## 2024-07-11 DIAGNOSIS — E119 Type 2 diabetes mellitus without complications: Secondary | ICD-10-CM | POA: Insufficient documentation

## 2024-07-11 DIAGNOSIS — R200 Anesthesia of skin: Secondary | ICD-10-CM | POA: Insufficient documentation

## 2024-07-11 DIAGNOSIS — Z7984 Long term (current) use of oral hypoglycemic drugs: Secondary | ICD-10-CM | POA: Insufficient documentation

## 2024-07-11 LAB — I-STAT CHEM 8, ED
BUN: 8 mg/dL (ref 6–20)
Calcium, Ion: 1.22 mmol/L (ref 1.15–1.40)
Chloride: 102 mmol/L (ref 98–111)
Creatinine, Ser: 0.7 mg/dL (ref 0.44–1.00)
Glucose, Bld: 205 mg/dL — ABNORMAL HIGH (ref 70–99)
HCT: 40 % (ref 36.0–46.0)
Hemoglobin: 13.6 g/dL (ref 12.0–15.0)
Potassium: 4 mmol/L (ref 3.5–5.1)
Sodium: 140 mmol/L (ref 135–145)
TCO2: 25 mmol/L (ref 22–32)

## 2024-07-11 LAB — COMPREHENSIVE METABOLIC PANEL WITH GFR
ALT: 28 U/L (ref 0–44)
AST: 22 U/L (ref 15–41)
Albumin: 4.1 g/dL (ref 3.5–5.0)
Alkaline Phosphatase: 59 U/L (ref 38–126)
Anion gap: 12 (ref 5–15)
BUN: 9 mg/dL (ref 6–20)
CO2: 24 mmol/L (ref 22–32)
Calcium: 9.5 mg/dL (ref 8.9–10.3)
Chloride: 104 mmol/L (ref 98–111)
Creatinine, Ser: 0.72 mg/dL (ref 0.44–1.00)
GFR, Estimated: >60 mL/min
Glucose, Bld: 214 mg/dL — ABNORMAL HIGH (ref 70–99)
Potassium: 4.1 mmol/L (ref 3.5–5.1)
Sodium: 140 mmol/L (ref 135–145)
Total Bilirubin: 0.4 mg/dL (ref 0.0–1.2)
Total Protein: 6.4 g/dL — ABNORMAL LOW (ref 6.5–8.1)

## 2024-07-11 LAB — DIFFERENTIAL
Abs Immature Granulocytes: 0.02 K/uL (ref 0.00–0.07)
Basophils Absolute: 0 K/uL (ref 0.0–0.1)
Basophils Relative: 1 %
Eosinophils Absolute: 0.3 K/uL (ref 0.0–0.5)
Eosinophils Relative: 3 %
Immature Granulocytes: 0 %
Lymphocytes Relative: 40 %
Lymphs Abs: 3.6 K/uL (ref 0.7–4.0)
Monocytes Absolute: 0.5 K/uL (ref 0.1–1.0)
Monocytes Relative: 5 %
Neutro Abs: 4.5 K/uL (ref 1.7–7.7)
Neutrophils Relative %: 51 %

## 2024-07-11 LAB — CBC
HCT: 40.5 % (ref 36.0–46.0)
Hemoglobin: 12.7 g/dL (ref 12.0–15.0)
MCH: 28.6 pg (ref 26.0–34.0)
MCHC: 31.4 g/dL (ref 30.0–36.0)
MCV: 91.2 fL (ref 80.0–100.0)
Platelets: 248 K/uL (ref 150–400)
RBC: 4.44 MIL/uL (ref 3.87–5.11)
RDW: 12.6 % (ref 11.5–15.5)
WBC: 8.9 K/uL (ref 4.0–10.5)
nRBC: 0 % (ref 0.0–0.2)

## 2024-07-11 LAB — PROTIME-INR
INR: 0.9 (ref 0.8–1.2)
Prothrombin Time: 13.1 s (ref 11.4–15.2)

## 2024-07-11 LAB — TROPONIN T, HIGH SENSITIVITY: Troponin T High Sensitivity: <15 ng/L (ref 0–19)

## 2024-07-11 LAB — APTT: aPTT: 24 s (ref 24–36)

## 2024-07-11 LAB — ETHANOL: Alcohol, Ethyl (B): <15 mg/dL

## 2024-07-11 MED ORDER — SODIUM CHLORIDE 0.9 % IV BOLUS
1000.0000 mL | Freq: Once | INTRAVENOUS | Status: AC
  Administered 2024-07-11: 1000 mL via INTRAVENOUS

## 2024-07-11 MED ORDER — DIPHENHYDRAMINE HCL 50 MG/ML IJ SOLN
25.0000 mg | Freq: Once | INTRAMUSCULAR | Status: AC
  Administered 2024-07-11: 25 mg via INTRAVENOUS
  Filled 2024-07-11: qty 1

## 2024-07-11 MED ORDER — ONDANSETRON HCL 4 MG PO TABS: 4.0000 mg | ORAL_TABLET | Freq: Four times a day (QID) | ORAL | 0 refills | Status: AC

## 2024-07-11 MED ORDER — METOCLOPRAMIDE HCL 10 MG PO TABS: 10.0000 mg | ORAL_TABLET | Freq: Four times a day (QID) | ORAL | 0 refills | Status: AC

## 2024-07-11 MED ORDER — KETOROLAC TROMETHAMINE 10 MG PO TABS: 10.0000 mg | ORAL_TABLET | Freq: Three times a day (TID) | ORAL | 0 refills | Status: AC | PRN

## 2024-07-11 MED ORDER — IOHEXOL 350 MG/ML SOLN
75.0000 mL | Freq: Once | INTRAVENOUS | Status: AC | PRN
  Administered 2024-07-11: 75 mL via INTRAVENOUS

## 2024-07-11 MED ORDER — PROCHLORPERAZINE EDISYLATE 10 MG/2ML IJ SOLN
10.0000 mg | Freq: Once | INTRAMUSCULAR | Status: AC
  Administered 2024-07-11: 10 mg via INTRAVENOUS
  Filled 2024-07-11: qty 2

## 2024-07-11 MED ORDER — KETOROLAC TROMETHAMINE 30 MG/ML IJ SOLN
30.0000 mg | Freq: Once | INTRAMUSCULAR | Status: AC
  Administered 2024-07-11: 30 mg via INTRAVENOUS
  Filled 2024-07-11: qty 1

## 2024-07-11 NOTE — Progress Notes (Signed)
 [facility] Telemedicine Consult   Requesting Provider: Redell Pinal Consult Participants: patient, ER nurse [place] Location of the provider: [facility] Location of the patient: [facility]  This consult was provided via telemedicine with 2-way video and audio communication. The patient/family was informed that care would be provided in this way and agreed to receive care in this manner.    Chief Complaint: Headache and left face and hand tingling  HPI: [name] is a 54 year old Caucasian lady with past medical history of diabetes, bipolar disorder who presents with sudden onset of bifrontal headache and left face and hand paresthesias at 3 PM today.  Patient describes the headache as being moderate to severe in intensity accompanied by nausea photophobia and sound sensitivity.  Headache is constant.  She has not tried any medications.  She describes tingling sensation in the lower of the face and the left as well as her fingertips which has also persisted.  She denies any prior history of strokes, TIAs, seizures or significant neurological problems.  She has remote history of migraine headaches when she was young but states that she had outgrown the headache.  She has not had any focal neurological symptoms with any of her prior migraine headaches.  CT head obtained in the ER shows no acute abnormality and CT angiogram brain and neck shows no large vessel stenosis or occlusion.    LKW: 3 p.m. tnk given?: No, too mild to treat. IR Thrombectomy? No, no LVO Modified Rankin Scale: 0-Completely asymptomatic and back to baseline post- stroke Time of teleneurologist evaluation:  535 pm  Exam: There were no vitals filed for this visit.  General: Obese middle-aged Caucasian lady not in distress.  Appears mildly anxious. Awake alert oriented to time place and person.  Speech and language appears normal.  Extraocular movements are full range without nystagmus.   Blinks to threat bilaterally.  Face is symmetric without weakness.  Subjective decrease sensation on the left lower half of the face.  Motor system exam shows symmetric upper and lower extremity strength without focal weakness.  Finger-to-nose and eatable coordination accurate.  Touch pinprick sensation in all 4 extremities is symmetric.  Gait not tested. 1A: Level of Consciousness - 0 1B: Ask Month and Age - 0 1C: 'Blink Eyes' & 'Squeeze Hands' - 0 2: Test Horizontal Extraocular Movements - 0 3: Test Visual Fields - 0 4: Test Facial Palsy - 0 5A: Test Left Arm Motor Drift - 0 5B: Test Right Arm Motor Drift - 0 6A: Test Left Leg Motor Drift - 0 6B: Test Right Leg Motor Drift - 0 7: Test Limb Ataxia - 0 8: Test Sensation - 1 left lower half of the face numbness 9: Test Language/Aphasia- 0 10: Test Dysarthria - 0 11: Test Extinction/Inattention - 0 NIHSS score: 1   Imaging Reviewed: CT head and CT angiogram brain and neck showed no LVO or occlusion or high-grade stenosis  Labs reviewed in epic and pertinent values follow: Pending   Assessment: 54 year old Caucasian lady with sudden onset of bifrontal headache and left face and hand paresthesias possibly typical migraine versus small right subcortical infarct from small vessel disease. Patient has presented within the time window for IV thrombolysis but neurological deficits are quite minimal and only subjective sensory loss hence I would not recommend TNK at this time as risk-benefit is not in favor of giving it.  If however patient neurological symptoms worsen and she develops new deficits may reconsider if she  is still in the time window. Not a candidate for thrombectomy as no LVO noted on CT angio Recommendations: Check MRI scan of the brain and if positive for small stroke admit for further stroke workup and restratification.  If MRI is negative for stroke treat with migraine cocktail and if symptoms resolve discharge home and follow-up  with with outpatient neurology. Long discussion with patient and answered questions. Discussed with Dr. Redell Pinal, ER, MD who agrees with plan.    This patient is receiving care for possible acute neurological changes. There was 55 minutes of care by this provider at the time of service, including time for direct evaluation via telemedicine, review of medical records, imaging studies and discussion of findings with providers, the patient and/or family.  Eather Popp MD [facility] [phone]  If 7pm- 7am, please page neurology on call as listed in AMION.

## 2024-07-11 NOTE — ED Notes (Signed)
 Patient transported to MRI

## 2024-07-11 NOTE — Discharge Instructions (Signed)
 We have strongly recommended that you get an MRI today but you were unable to complete this and have desired to be discharged.  Please take the medications as prescribed including Zofran  as needed for nausea, Reglan  every 8 hours as needed for nausea and headache, I have given you a small amount of Toradol  as well.  If you should develop severe or worsening symptoms including worsening weakness numbness difficulty with speech vision or any other abnormal findings please return immediately for a repeat evaluation.  Thank you for allowing us  to treat you in the emergency department today.  After reviewing your examination and potential testing that was done it appears that you are safe to go home.  I would like for you to follow-up with your doctor within the next several days, have them obtain your records and follow-up with them to review all potential tests and results from your visit.  If you should develop severe or worsening symptoms return to the emergency department immediately

## 2024-07-11 NOTE — ED Triage Notes (Signed)
 Pt states around 2-3pm she developed headache and left sided hand numbness and sensory deficit on the left side.

## 2024-07-11 NOTE — ED Provider Notes (Signed)
 " [place] EM[facility]ENCY DEPARTMENT AT [facility] Provider Note   CSN: [id number] Arrival date & time: 07/11/24  1619     Patient presents with: Code Stroke   PARVEEN FREEHLING is a 54 y.o. female.   HPI   This patient is a 54 year old female who is a known diabetic on metformin  and Ozempic, she presents from her work where she was acutely symptomatic today approximately 1-1/2 hours ago with severe headache which came on acutely at about 3:00 PM concurrent with that with some numbness of the left side of the face and the left arm.  She has no difficulty speaking, her headache is ongoing and severe.  She was noted to be slightly hypertensive prehospital.  No fevers no vomiting no weakness no difficulty with the legs.  She has had a prior fasciotomy in the right leg and a prior traumatic brain injury, she denies any prior history of bleeding in the brain.  Prior to Admission medications  Medication Sig Start Date End Date Taking? Authorizing Provider  ketorolac  (TORADOL ) 10 MG tablet Take 1 tablet (10 mg total) by mouth every 8 (eight) hours as needed for moderate pain (pain score 4-6). 07/11/24  Yes Cleotilde Rogue, MD  metoCLOPramide  (REGLAN ) 10 MG tablet Take 1 tablet (10 mg total) by mouth every 6 (six) hours. 07/11/24  Yes Cleotilde Rogue, MD  ondansetron  (ZOFRAN ) 4 MG tablet Take 1 tablet (4 mg total) by mouth every 6 (six) hours. 07/11/24  Yes Cleotilde Rogue, MD  albuterol  (VENTOLIN  HFA) 108 (90 Base) MCG/ACT inhaler Inhale 2 puffs into the lungs every 6 (six) hours as needed. 07/03/23   Leath-Warren, Etta PARAS, NP  ALPRAZolam (XANAX) 0.5 MG tablet Take 0.5 mg by mouth 2 (two) times daily.    [provider]  Aspirin -Acetaminophen -Caffeine (GOODY HEADACHE PO) Take 1 each by mouth 3 (three) times daily as needed (headaches).    [provider]  benzonatate  (TESSALON ) 100 MG capsule Take 1 capsule (100 mg total) by mouth 3 (three) times daily as needed for cough. Do not take  with alcohol or while driving or operating heavy machinery.  May cause drowsiness. 11/04/22   Chandra Harlene DELENA, NP  cyanocobalamin (VITAMIN B12) 1000 MCG/ML injection Inject into the muscle. 07/08/22   [provider]  cyclobenzaprine (FLEXERIL) 5 MG tablet Take by mouth. 02/24/22   [provider]  EPINEPHrine  0.3 mg/0.3 mL IJ SOAJ injection Inject 0.3 mg into the muscle as needed for anaphylaxis. 09/26/22   Emelia Sluder, PA-C  lidocaine  (LIDODERM ) 5 % Place 1 patch onto the skin daily. Remove & Discard patch within 12 hours or as directed by MD 02/24/23   Lang Norleen POUR, PA-C  lidocaine  (XYLOCAINE ) 2 % solution Use as directed 15 mLs in the mouth or throat every 3 (three) hours as needed for mouth pain. Gargle and spit as needed for throat pain 11/04/22   Chandra Harlene A, NP  metFORMIN  (GLUCOPHAGE -XR) 500 MG 24 hr tablet TAKE 2 TABLETS BY MOUTH EVERY DAY 04/05/24   Therisa Benton PARAS, NP  oxyCODONE-acetaminophen  (PERCOCET) 10-325 MG tablet Take 1 tablet by mouth every 4 (four) hours as needed for pain.    [provider]  Semaglutide, 2 MG/DOSE, (OZEMPIC, 2 MG/DOSE,) 8 MG/3ML SOPN 2 mg Subcutaneous weekly for 30 days 11/14/22   [provider]  traMADol (ULTRAM) 50 MG tablet Take 50 mg by mouth every 6 (six) hours as needed for moderate pain.    [provider]  Allergies: Atorvastatin, Ibuprofen, Molds & smuts, and Statins    Review of Systems  All other systems reviewed and are negative.   Updated Vital Signs BP 122/70   Pulse 84   Temp 98.7 F (37.1 C) (Oral)   Resp 16   Ht 1.803 m (5' 11)   Wt 94.8 kg   LMP 10/11/2018   SpO2 94%   BMI 29.15 kg/m   Physical Exam Vitals and nursing note reviewed.  Constitutional:      General: She is not in acute distress.    Appearance: She is well-developed.  HENT:     Head: Normocephalic and atraumatic.     Mouth/Throat:     Pharynx: No oropharyngeal exudate.  Eyes:     General: No scleral  icterus.       Right eye: No discharge.        Left eye: No discharge.     Conjunctiva/sclera: Conjunctivae normal.     Pupils: Pupils are equal, round, and reactive to light.  Neck:     Thyroid : No thyromegaly.     Vascular: No JVD.  Cardiovascular:     Rate and Rhythm: Normal rate and regular rhythm.     Heart sounds: Normal heart sounds. No murmur heard.    No friction rub. No gallop.  Pulmonary:     Effort: Pulmonary effort is normal. No respiratory distress.     Breath sounds: Normal breath sounds. No wheezing or rales.  Abdominal:     General: Bowel sounds are normal. There is no distension.     Palpations: Abdomen is soft. There is no mass.     Tenderness: There is no abdominal tenderness.  Musculoskeletal:        General: No tenderness. Normal range of motion.     Cervical back: Normal range of motion and neck supple.  Lymphadenopathy:     Cervical: No cervical adenopathy.  Skin:    General: Skin is warm and dry.     Findings: No erythema or rash.  Neurological:     Mental Status: She is alert.     Coordination: Coordination normal.     Comments: Cranial nerves III through XII are normal except for a slight asymmetrical sensory deficit to the left side of the face as well as the left upper extremity at the hand, it is difficult to tell her lower extremities because of her fasciotomy and chronic numbness of the right leg.  She has no pronator drift, she has normal finger-nose-finger, she has normal speech and memory, cranial nerves III through XII are normal including peripheral visual fields  Psychiatric:        Behavior: Behavior normal.     (all labs ordered are listed, but only abnormal results are displayed) Labs Reviewed  COMPREHENSIVE METABOLIC PANEL WITH GFR - Abnormal; Notable for the following components:      Result Value   Glucose, Bld 214 (*)    Total Protein 6.4 (*)    All other components within normal limits  I-STAT CHEM 8, ED - Abnormal; Notable for  the following components:   Glucose, Bld 205 (*)    All other components within normal limits  PROTIME-INR  APTT  CBC  DIFFERENTIAL  ETHANOL  URINE DRUG SCREEN  TROPONIN T, HIGH SENSITIVITY    EKG: EKG Interpretation Date/Time:  Monday July 11 2024 16:50:06 EST Ventricular Rate:  91 PR Interval:  157 QRS Duration:  85 QT Interval:  348 QTC Calculation: 429 R  Axis:   -4  Text Interpretation: Sinus rhythm Normal ECG since last tracing no significant change Confirmed by Cleotilde Rogue ([id number]) on 07/11/2024 4:58:05 PM  Radiology: CT ANGIO HEAD NECK W WO CM (CODE STROKE) Result Date: 07/11/2024 EXAM: CTA HEAD AND NECK WITH AND WITHOUT 07/11/2024 04:42:55 PM TECHNIQUE: CTA of the head and neck was performed with and without the administration of intravenous contrast. 75 mL iohexol  (OMNIPAQUE ) 350 MG/ML injection. Multiplanar 2D and/or 3D reformatted images are provided for review. Automated exposure control, iterative reconstruction, and/or weight based adjustment of the mA/kV was utilized to reduce the radiation dose to as low as reasonably achievable. Stenosis of the internal carotid arteries measured using NASCET criteria. COMPARISON: None available CLINICAL HISTORY: Neuro deficit, acute, stroke suspected. Headache and left sided numbness. FINDINGS: CTA NECK: AORTIC ARCH AND ARCH VESSELS: No dissection or arterial injury. No significant stenosis of the brachiocephalic or subclavian arteries. CERVICAL CAROTID ARTERIES: Mild beading of the mid to distal cervical ICAs bilaterally. No dissection or arterial injury. No hemodynamically significant stenosis by NASCET criteria. CERVICAL VERTEBRAL ARTERIES: Codominant vertebral arteries. Calcified plaque at the origin of the left vertebral artery not resulting in a significant stenosis. No dissection or arterial injury. LUNGS AND MEDIASTINUM: Unremarkable. SOFT TISSUES: Small thyroid  nodules measuring up to 1 cm for which no follow up imaging is  recommended. No acute abnormality. BONES: No acute abnormality. CTA HEAD: ANTERIOR CIRCULATION: The intracranial internal carotid arteries are widely patent with minimal nonstenotic calcification in the supraclinoid segments. ACAs and MCAs are patent without evidence of a proximal branch occlusion or significant proximal stenosis. No aneurysm. POSTERIOR CIRCULATION: The intracranial vertebral arteries are widely patent to the basilar. Patent right PICA, bilateral AICA, and bilateral SCA origins are visualized. The basilar artery is widely patent. Posterior communicating arteries are diminutive or absent. Both PCAs are patent without evidence of a significant proximal stenosis. No aneurysm. OTHER: No dural venous sinus thrombosis on this non-dedicated study. These findings were communicated by telephone to Dr. Rogue Cleotilde on 07/11/2024 at 4:48 PM. IMPRESSION: 1. No large vessel occlusion or significant stenosis in the head or neck. 2. Mild beading of the cervical ICAs, compatible with fibromuscular dysplasia. Electronically signed by: Dasie Hamburg MD MD 07/11/2024 05:00 PM EST RP Workstation: [id number]   CT HEAD CODE STROKE WO CONTRAST (LKW 0-4.5h, LVO 0-24h) Result Date: 07/11/2024 EXAM: CT HEAD WITHOUT CONTRAST 07/11/2024 04:39:28 PM TECHNIQUE: CT of the head was performed without the administration of intravenous contrast. Automated exposure control, iterative reconstruction, and/or weight based adjustment of the mA/kV was utilized to reduce the radiation dose to as low as reasonably achievable. COMPARISON: CT head 02/24/2023. CLINICAL HISTORY: Neurological deficit, acute, stroke suspected. Left sided weakness. FINDINGS: BRAIN AND VENTRICLES: There is no evidence of an acute infarct, intracranial hemorrhage, mass, midline shift, hydrocephalus, or extra-axial fluid collection. Cerebral volume is normal. ORBITS: No acute abnormality. SINUSES: No acute abnormality. SOFT TISSUES AND SKULL: No acute soft tissue  abnormality. No skull fracture. [place] Stroke Program Early CT Score (ASPECTS) ----- Ganglionic (caudate, IC, lentiform nucleus, insula, M1-M3): 7 Supraganglionic (M4-M6): 3 Total: 10 These findings were communicated by telephone to Dr. Rogue Cleotilde on 07/11/2024 at 4:48 PM. IMPRESSION: 1. Negative head CT. ASPECTS of 10. Electronically signed by: Dasie Hamburg MD MD 07/11/2024 04:48 PM EST RP Workstation: [id number]     Procedures   Medications Ordered in the ED  iohexol  (OMNIPAQUE ) 350 MG/ML injection 75 mL (75 mLs Intravenous Contrast Given 07/11/24 1635)  ketorolac  (TORADOL )  30 MG/ML injection 30 mg (30 mg Intravenous Given 07/11/24 1707)  prochlorperazine  (COMPAZINE ) injection 10 mg (10 mg Intravenous Given 07/11/24 1708)  diphenhydrAMINE  (BENADRYL ) injection 25 mg (25 mg Intravenous Given 07/11/24 1708)  sodium chloride  0.9 % bolus 1,000 mL (1,000 mLs Intravenous New Bag/Given 07/11/24 1707)                                    Medical Decision Making Amount and/or Complexity of Data Reviewed Labs: ordered. Radiology: ordered.  Risk Prescription drug management.    This patient presents to the ED for concern of acute onset of headache with associated numbness, this involves an extensive number of treatment options, and is a complaint that carries with it a high risk of complications and morbidity.  The differential diagnosis includes stroke, subarachnoid, hemorrhage, seizure, metabolic abnormalities   Co morbidities / Chronic conditions that complicate the patient evaluation  Prior traumatic brain injury   Additional history obtained:  Additional history obtained from EMR External records from outside source obtained and reviewed including paramedics, no recent admissions to the hospital followed for her diabetes in the office by her doctor, no recent admissions to the hospital going back several years   Lab Tests:  I Ordered, and personally interpreted labs.  The pertinent  results include:   CMP overall unremarkable except for mild hyperglycemia, alcohol and troponin undetectable   Imaging Studies ordered:  I ordered imaging studies including CT scan of the head as well as CT angiogram I independently visualized and interpreted imaging which showed no acute findings I agree with the radiologist interpretation   Cardiac Monitoring: / EKG:  The patient was maintained on a cardiac monitor.  I personally viewed and interpreted the cardiac monitored which showed an underlying rhythm of: Normal sinus rhythm   Problem List / ED Course / Critical interventions / Medication management  The patient was brought over for MRI after discussion with neurology.  They recommended that she get an MRI but the patient refused and did not want to lay flat and in that machine stating that she was too claustrophobic and did not want sedating medications I ordered medication including headache cocktail, Toradol , Compazine , Benadryl , fluids Reevaluation of the patient after these medicines showed that the patient resolved, she has no headache and her symptoms are almost completely resolved with the numbness on the face and arm. I have reviewed the patients home medicines and have made adjustments as needed   Consultations Obtained:  I requested consultation with the neurologist Dr. Rosemarie,  and discussed lab and imaging findings as well as pertinent plan - they recommend: Giving a headache cocktail as this is possibly a complicated migraine versus a small cortical stroke.  He request an MRI and if the MRI is positive the patient would be admitted to this location, if the MRI is negative treat this as a complicated migraine and the patient can be discharged   Social Determinants of Health:  None   Test / Admission - Considered:  Considered MRI but patient refused, patient given instructions for return as she is insisting on being discharged.  She understands indications for  return.      Final diagnoses:  Severe headache  Facial numbness    ED Discharge Orders          Ordered    ondansetron  (ZOFRAN ) 4 MG tablet  Every 6 hours  07/11/24 1815    metoCLOPramide  (REGLAN ) 10 MG tablet  Every 6 hours        07/11/24 1815    ketorolac  (TORADOL ) 10 MG tablet  Every 8 hours PRN        07/11/24 1815               Cleotilde Rogue, MD 07/11/24 1819  "

## 2024-07-11 NOTE — ED Notes (Signed)
 Pt attempted MRI. Per pt, the machine was too small and it made her anxious. Pt offered something to help her relax, pt declined. Pt refused to try MRI again. EDP aware.

## 2024-08-12 ENCOUNTER — Ambulatory Visit: Admitting: Nurse Practitioner
